# Patient Record
Sex: Female | Born: 1935 | Race: White | Hispanic: No | Smoking: Never smoker
Health system: Southern US, Community
[De-identification: ages and names within clinical notes are randomized; demographics above are authoritative.]

## PROBLEM LIST (undated history)

## (undated) DIAGNOSIS — E079 Disorder of thyroid, unspecified: Secondary | ICD-10-CM

## (undated) DIAGNOSIS — T8859XA Other complications of anesthesia, initial encounter: Secondary | ICD-10-CM

## (undated) DIAGNOSIS — M1990 Unspecified osteoarthritis, unspecified site: Secondary | ICD-10-CM

## (undated) DIAGNOSIS — K219 Gastro-esophageal reflux disease without esophagitis: Secondary | ICD-10-CM

## (undated) DIAGNOSIS — I499 Cardiac arrhythmia, unspecified: Secondary | ICD-10-CM

## (undated) DIAGNOSIS — C9110 Chronic lymphocytic leukemia of B-cell type not having achieved remission: Secondary | ICD-10-CM

## (undated) DIAGNOSIS — E785 Hyperlipidemia, unspecified: Secondary | ICD-10-CM

## (undated) DIAGNOSIS — I10 Essential (primary) hypertension: Secondary | ICD-10-CM

## (undated) DIAGNOSIS — I2510 Atherosclerotic heart disease of native coronary artery without angina pectoris: Secondary | ICD-10-CM

## (undated) DIAGNOSIS — D649 Anemia, unspecified: Secondary | ICD-10-CM

## (undated) DIAGNOSIS — E119 Type 2 diabetes mellitus without complications: Secondary | ICD-10-CM

## (undated) DIAGNOSIS — T4145XA Adverse effect of unspecified anesthetic, initial encounter: Secondary | ICD-10-CM

## (undated) DIAGNOSIS — R609 Edema, unspecified: Secondary | ICD-10-CM

## (undated) DIAGNOSIS — K5790 Diverticulosis of intestine, part unspecified, without perforation or abscess without bleeding: Secondary | ICD-10-CM

## (undated) HISTORY — DX: Atherosclerotic heart disease of native coronary artery without angina pectoris: I25.10

## (undated) HISTORY — DX: Chronic lymphocytic leukemia of B-cell type not having achieved remission: C91.10

## (undated) HISTORY — DX: Essential (primary) hypertension: I10

## (undated) HISTORY — DX: Disorder of thyroid, unspecified: E07.9

## (undated) HISTORY — DX: Hyperlipidemia, unspecified: E78.5

## (undated) HISTORY — DX: Diverticulosis of intestine, part unspecified, without perforation or abscess without bleeding: K57.90

## (undated) HISTORY — DX: Type 2 diabetes mellitus without complications: E11.9

---

## 1998-09-01 HISTORY — PX: CORONARY ARTERY BYPASS GRAFT: SHX141

## 2004-10-21 ENCOUNTER — Ambulatory Visit: Payer: Self-pay

## 2004-10-30 ENCOUNTER — Ambulatory Visit: Payer: Self-pay

## 2004-12-03 ENCOUNTER — Ambulatory Visit: Payer: Self-pay

## 2004-12-24 ENCOUNTER — Ambulatory Visit: Payer: Self-pay | Admitting: Internal Medicine

## 2004-12-30 ENCOUNTER — Ambulatory Visit: Payer: Self-pay

## 2005-01-30 ENCOUNTER — Ambulatory Visit: Payer: Self-pay

## 2005-03-01 ENCOUNTER — Ambulatory Visit: Payer: Self-pay

## 2005-11-12 ENCOUNTER — Ambulatory Visit: Payer: Self-pay | Admitting: Unknown Physician Specialty

## 2005-11-26 ENCOUNTER — Ambulatory Visit: Payer: Self-pay | Admitting: Unknown Physician Specialty

## 2006-01-20 ENCOUNTER — Ambulatory Visit: Payer: Self-pay | Admitting: Internal Medicine

## 2006-04-28 ENCOUNTER — Ambulatory Visit: Payer: Self-pay | Admitting: Unknown Physician Specialty

## 2007-04-08 ENCOUNTER — Ambulatory Visit: Payer: Self-pay | Admitting: Internal Medicine

## 2007-05-12 ENCOUNTER — Ambulatory Visit: Payer: Self-pay | Admitting: Internal Medicine

## 2007-06-02 ENCOUNTER — Ambulatory Visit: Payer: Self-pay | Admitting: Internal Medicine

## 2008-05-31 ENCOUNTER — Ambulatory Visit: Payer: Self-pay | Admitting: Internal Medicine

## 2008-06-01 ENCOUNTER — Ambulatory Visit: Payer: Self-pay | Admitting: Internal Medicine

## 2008-06-21 ENCOUNTER — Ambulatory Visit: Payer: Self-pay | Admitting: Internal Medicine

## 2008-07-02 ENCOUNTER — Ambulatory Visit: Payer: Self-pay | Admitting: Internal Medicine

## 2008-09-04 ENCOUNTER — Ambulatory Visit: Payer: Self-pay | Admitting: Internal Medicine

## 2008-10-02 ENCOUNTER — Ambulatory Visit: Payer: Self-pay | Admitting: Internal Medicine

## 2009-04-01 ENCOUNTER — Ambulatory Visit: Payer: Self-pay | Admitting: Internal Medicine

## 2009-04-18 ENCOUNTER — Ambulatory Visit: Payer: Self-pay | Admitting: Internal Medicine

## 2009-05-02 ENCOUNTER — Ambulatory Visit: Payer: Self-pay | Admitting: Internal Medicine

## 2009-07-10 ENCOUNTER — Ambulatory Visit: Payer: Self-pay | Admitting: Internal Medicine

## 2010-09-04 ENCOUNTER — Ambulatory Visit: Payer: Self-pay | Admitting: Internal Medicine

## 2011-02-17 ENCOUNTER — Encounter: Payer: Self-pay | Admitting: Otolaryngology

## 2011-03-02 ENCOUNTER — Encounter: Payer: Self-pay | Admitting: Otolaryngology

## 2011-04-02 ENCOUNTER — Encounter: Payer: Self-pay | Admitting: Otolaryngology

## 2012-01-08 ENCOUNTER — Ambulatory Visit: Payer: Self-pay | Admitting: Internal Medicine

## 2013-01-10 ENCOUNTER — Ambulatory Visit: Payer: Self-pay | Admitting: Internal Medicine

## 2014-01-25 ENCOUNTER — Ambulatory Visit: Payer: Self-pay | Admitting: Internal Medicine

## 2014-04-26 ENCOUNTER — Ambulatory Visit: Payer: Self-pay | Admitting: Internal Medicine

## 2014-05-02 ENCOUNTER — Ambulatory Visit: Payer: Self-pay | Admitting: Internal Medicine

## 2015-05-08 ENCOUNTER — Ambulatory Visit
Admission: RE | Admit: 2015-05-08 | Discharge: 2015-05-08 | Disposition: A | Discharge status: Home / Self Care | Payer: Medicare Other | Source: Ambulatory Visit | Attending: Otolaryngology | Admitting: Otolaryngology

## 2015-05-08 ENCOUNTER — Other Ambulatory Visit: Payer: Self-pay | Admitting: Otolaryngology

## 2015-05-08 DIAGNOSIS — Z951 Presence of aortocoronary bypass graft: Secondary | ICD-10-CM | POA: Diagnosis not present

## 2015-05-08 DIAGNOSIS — R05 Cough: Secondary | ICD-10-CM

## 2015-05-08 DIAGNOSIS — R059 Cough, unspecified: Secondary | ICD-10-CM

## 2016-04-09 ENCOUNTER — Other Ambulatory Visit: Payer: Self-pay | Admitting: Internal Medicine

## 2017-11-06 ENCOUNTER — Other Ambulatory Visit: Payer: Self-pay | Admitting: Otolaryngology

## 2017-11-06 ENCOUNTER — Ambulatory Visit
Admission: RE | Admit: 2017-11-06 | Discharge: 2017-11-06 | Disposition: A | Discharge status: Home / Self Care | Payer: Medicare HMO | Source: Ambulatory Visit | Attending: Otolaryngology | Admitting: Otolaryngology

## 2017-11-06 DIAGNOSIS — I700 Atherosclerosis of aorta: Secondary | ICD-10-CM | POA: Insufficient documentation

## 2017-11-06 DIAGNOSIS — R05 Cough: Secondary | ICD-10-CM | POA: Insufficient documentation

## 2017-11-06 DIAGNOSIS — R059 Cough, unspecified: Secondary | ICD-10-CM

## 2018-05-24 ENCOUNTER — Encounter: Payer: Medicare HMO | Attending: Internal Medicine | Admitting: Dietician

## 2018-05-24 ENCOUNTER — Encounter: Payer: Self-pay | Admitting: Dietician

## 2018-05-24 VITALS — BP 132/60 | Wt 156.7 lb

## 2018-05-24 DIAGNOSIS — Z9641 Presence of insulin pump (external) (internal): Secondary | ICD-10-CM | POA: Insufficient documentation

## 2018-05-24 DIAGNOSIS — Z713 Dietary counseling and surveillance: Secondary | ICD-10-CM | POA: Insufficient documentation

## 2018-05-24 DIAGNOSIS — E10649 Type 1 diabetes mellitus with hypoglycemia without coma: Secondary | ICD-10-CM | POA: Insufficient documentation

## 2018-05-24 DIAGNOSIS — E109 Type 1 diabetes mellitus without complications: Secondary | ICD-10-CM

## 2018-05-24 NOTE — Patient Instructions (Addendum)
Take meal boluses 15 min. before eating when able Change pump set and site every 2-3 days Carry glucose tablets or candy at all times Call [facility] with any questions or problems [phone]

## 2018-05-25 ENCOUNTER — Encounter: Payer: Self-pay | Admitting: Dietician

## 2018-05-25 NOTE — Progress Notes (Signed)
Called pt at 10:20am-pt reports FBG 400 this am-she removed her pump site and it was kinked-gave correction by a needle and inserted new pump site. To call me in 1-2 hours if BG not coming down

## 2018-05-27 NOTE — Progress Notes (Signed)
Diabetes Self-Management Education  Visit Type: First/Initial  Appt. Start Time: 1500 Appt. End Time: 1700  05/27/2018  [name], identified by name and date of birth, is a 84 y.o. female with a diagnosis of Diabetes: Type 1.   ASSESSMENT  Blood pressure 132/60, weight 156 lb 11.2 oz (71.1 kg). There is no height or weight on file to calculate BMI.  Diabetes Self-Management Education - 05/27/18 1120      Visit Information   Visit Type  First/Initial      Initial Visit   Diabetes Type  Type 1      Health Coping   How would you rate your overall health?  Fair      Psychosocial Assessment   Patient Belief/Attitude about Diabetes  Motivated to manage diabetes    Self-care barriers  None    Self-management support  Doctor's office;Family    Other persons present  Patient    Patient Concerns  Glycemic Control   for 670G pump training (upgrade), prevent complications, become more fit   Special Needs  None    Preferred Learning Style  Visual;Hands on    Learning Readiness  Ready    What is the last grade level you completed in school?  college      Pre-Education Assessment   Patient understands the diabetes disease and treatment process.  Demonstrates understanding / competency    Patient understands incorporating nutritional management into lifestyle.  Demonstrates understanding / competency    Patient undertands incorporating physical activity into lifestyle.  Demonstrates understanding / competency    Patient understands using medications safely.  Needs Instruction   needs training on new Medtronic 670G insulin pump(upgrade-currently wears older Medtronic  pump)   Patient understands monitoring blood glucose, interpreting and using results  Demonstrates understanding / competency    Patient understands prevention, detection, and treatment of acute complications.  Demonstrates understanding / competency    Patient understands prevention, detection, and treatment of  chronic complications.  Demonstrates understanding / competency    Patient understands how to develop strategies to address psychosocial issues.  Demonstrates understanding / competency    Patient understands how to develop strategies to promote health/change behavior.  Demonstrates understanding / competency      Complications   Last HgB A1C per patient/outside source  7 %   12-07-17   How often do you check your blood sugar?  > 4 times/day ; BG 162 prior to starting new pump   Have you had a dilated eye exam in the past 12 months?  Yes   12-2017   Have you had a dental exam in the past 12 months?  Yes   11-2017   Are you checking your feet?  Yes    How many days per week are you checking your feet?  7      Dietary Intake   Breakfast  eats breakfast at 8a (lives at [facility] and eats prepared meals there    Snack (morning)  as needed for low BG    Lunch  lunch at 12p    Snack (afternoon)  as needed for low BG    Dinner  supper at [employer] (evening)  as needed for low BG    Beverage(s)  drinks water 4-5x/day and diet  or unsweetened beverages 6-7x/day      Exercise   Exercise Type  Light (walking / raking leaves)   wts and cardio     Patient Education  Previous Diabetes Education  Yes (please comment)    Medications  Reviewed patients medication for diabetes, action, purpose, timing of dose and side effects.   trained on features and use of Medtronic 670G insulin pump using Novolog  and started on pump with settings from old pump and per MD    Acute complications  Taught treatment of hypoglycemia - the 15 rule.;Discussed and identified patients' treatment of hyperglycemia.      Outcomes   Expected Outcomes  Demonstrated interest in learning. Expect positive outcomes       Individualized Plan for Diabetes Self-Management Training:   Learning Objective:  Patient will have a greater understanding of diabetes self-management. Patient education plan is to attend  individual and/or group sessions per assessed needs and concerns.   Plan:   Patient Instructions  Take meal boluses 15 min. before eating when able Carry candy or glucose tablets at all times Change pump set and site every 2-3 days Call [facility] with any questions or problems   Expected Outcomes:  Demonstrated interest in learning. Expect positive outcomes  Education material provided: Medtronic Training booklet for 670G pump  If problems or questions, patient to contact team via:  [phone]  Future DSME appointment:

## 2018-05-31 ENCOUNTER — Encounter: Payer: Self-pay | Admitting: Dietician

## 2018-05-31 NOTE — Progress Notes (Signed)
Called pt on 05-29-18 and pt reports no problems with pump and BG's have been much better 90's-100's; advised pt again to call me if any problems or questions arise

## 2018-05-31 NOTE — Progress Notes (Signed)
Called pt on 05-26-18-pt reports she changed her reservoir and  insertion site without problem but BG's have been elevated mostly 300's and she has been taking injection of Novolog when pump bolus does not bring down BG and it comes down to 200's but then goes back up-also reports having small-moderate urine ketones. Had pt go into pump and check basal rates which reveals zero basal rates!  Pt reports she has been pushing buttons on her pump and apparently erased the basal rates.  Assisted pt in programming basal rates into pump again and checked ICR, ISF, BG targets and active insulin time which were all correct.  Advised pt to call me if she continues to have elevated BG's.

## 2018-06-07 ENCOUNTER — Encounter: Payer: Self-pay | Admitting: *Deleted

## 2018-06-08 ENCOUNTER — Encounter
Admission: RE | Disposition: A | Discharge status: Home / Self Care | Payer: Self-pay | Source: Ambulatory Visit | Attending: Ophthalmology

## 2018-06-08 ENCOUNTER — Ambulatory Visit: Payer: Medicare HMO | Admitting: Anesthesiology

## 2018-06-08 ENCOUNTER — Other Ambulatory Visit: Payer: Self-pay

## 2018-06-08 ENCOUNTER — Ambulatory Visit
Admission: RE | Admit: 2018-06-08 | Discharge: 2018-06-08 | Disposition: A | Discharge status: Home / Self Care | Payer: Medicare HMO | Source: Ambulatory Visit | Attending: Ophthalmology | Admitting: Ophthalmology

## 2018-06-08 DIAGNOSIS — K219 Gastro-esophageal reflux disease without esophagitis: Secondary | ICD-10-CM | POA: Insufficient documentation

## 2018-06-08 DIAGNOSIS — Z79899 Other long term (current) drug therapy: Secondary | ICD-10-CM | POA: Diagnosis not present

## 2018-06-08 DIAGNOSIS — C9110 Chronic lymphocytic leukemia of B-cell type not having achieved remission: Secondary | ICD-10-CM | POA: Diagnosis not present

## 2018-06-08 DIAGNOSIS — H2512 Age-related nuclear cataract, left eye: Secondary | ICD-10-CM | POA: Diagnosis present

## 2018-06-08 DIAGNOSIS — Z794 Long term (current) use of insulin: Secondary | ICD-10-CM | POA: Insufficient documentation

## 2018-06-08 DIAGNOSIS — I2510 Atherosclerotic heart disease of native coronary artery without angina pectoris: Secondary | ICD-10-CM | POA: Insufficient documentation

## 2018-06-08 DIAGNOSIS — I10 Essential (primary) hypertension: Secondary | ICD-10-CM | POA: Insufficient documentation

## 2018-06-08 DIAGNOSIS — E785 Hyperlipidemia, unspecified: Secondary | ICD-10-CM | POA: Insufficient documentation

## 2018-06-08 DIAGNOSIS — E1136 Type 2 diabetes mellitus with diabetic cataract: Secondary | ICD-10-CM | POA: Insufficient documentation

## 2018-06-08 DIAGNOSIS — Z951 Presence of aortocoronary bypass graft: Secondary | ICD-10-CM | POA: Insufficient documentation

## 2018-06-08 DIAGNOSIS — Z7982 Long term (current) use of aspirin: Secondary | ICD-10-CM | POA: Diagnosis not present

## 2018-06-08 HISTORY — DX: Edema, unspecified: R60.9

## 2018-06-08 HISTORY — DX: Gastro-esophageal reflux disease without esophagitis: K21.9

## 2018-06-08 HISTORY — DX: Cardiac arrhythmia, unspecified: I49.9

## 2018-06-08 HISTORY — PX: CATARACT EXTRACTION W/PHACO: SHX586

## 2018-06-08 LAB — GLUCOSE, CAPILLARY: GLUCOSE-CAPILLARY: 255 mg/dL — AB (ref 70–99)

## 2018-06-08 SURGERY — PHACOEMULSIFICATION, CATARACT, WITH IOL INSERTION
Anesthesia: Monitor Anesthesia Care | Site: Eye | Laterality: Left

## 2018-06-08 MED ORDER — EPINEPHRINE PF 1 MG/ML IJ SOLN
INTRAMUSCULAR | Status: AC
  Filled 2018-06-08: qty 1

## 2018-06-08 MED ORDER — NA CHONDROIT SULF-NA HYALURON 40-17 MG/ML IO SOLN
INTRAOCULAR | Status: DC | PRN
  Administered 2018-06-08: 1 mL via INTRAOCULAR

## 2018-06-08 MED ORDER — MOXIFLOXACIN HCL 0.5 % OP SOLN: 1.0000 [drp] | OPHTHALMIC | Status: DC | PRN

## 2018-06-08 MED ORDER — MOXIFLOXACIN HCL 0.5 % OP SOLN
OPHTHALMIC | Status: AC
  Filled 2018-06-08: qty 3

## 2018-06-08 MED ORDER — POVIDONE-IODINE 5 % OP SOLN
OPHTHALMIC | Status: DC | PRN
  Administered 2018-06-08: 1 via OPHTHALMIC

## 2018-06-08 MED ORDER — POVIDONE-IODINE 5 % OP SOLN
OPHTHALMIC | Status: AC
  Filled 2018-06-08: qty 30

## 2018-06-08 MED ORDER — NA CHONDROIT SULF-NA HYALURON 40-17 MG/ML IO SOLN
INTRAOCULAR | Status: AC
  Filled 2018-06-08: qty 1

## 2018-06-08 MED ORDER — TETRACAINE HCL 0.5 % OP SOLN
OPHTHALMIC | Status: AC
  Administered 2018-06-08: 1 [drp] via OPHTHALMIC
  Filled 2018-06-08: qty 4

## 2018-06-08 MED ORDER — MIDAZOLAM HCL 2 MG/2ML IJ SOLN
INTRAMUSCULAR | Status: AC
  Filled 2018-06-08: qty 2

## 2018-06-08 MED ORDER — MOXIFLOXACIN HCL 0.5 % OP SOLN
OPHTHALMIC | Status: DC | PRN
  Administered 2018-06-08: .2 mL via OPHTHALMIC

## 2018-06-08 MED ORDER — LIDOCAINE HCL (PF) 4 % IJ SOLN
INTRAMUSCULAR | Status: AC
  Filled 2018-06-08: qty 5

## 2018-06-08 MED ORDER — CARBACHOL 0.01 % IO SOLN
INTRAOCULAR | Status: DC | PRN
  Administered 2018-06-08: .5 mL via INTRAOCULAR

## 2018-06-08 MED ORDER — EPINEPHRINE PF 1 MG/ML IJ SOLN
INTRAOCULAR | Status: DC | PRN
  Administered 2018-06-08: 1 mL via OPHTHALMIC

## 2018-06-08 MED ORDER — ARMC OPHTHALMIC DILATING DROPS
OPHTHALMIC | Status: AC
  Administered 2018-06-08: 1 via OPHTHALMIC
  Filled 2018-06-08: qty 0.5

## 2018-06-08 MED ORDER — MIDAZOLAM HCL 2 MG/2ML IJ SOLN
INTRAMUSCULAR | Status: DC | PRN
  Administered 2018-06-08: 0.5 mg via INTRAVENOUS
  Administered 2018-06-08: .5 mg via INTRAVENOUS

## 2018-06-08 MED ORDER — ARMC OPHTHALMIC DILATING DROPS
1.0000 "application " | OPHTHALMIC | Status: AC
  Administered 2018-06-08 (×3): 1 via OPHTHALMIC

## 2018-06-08 MED ORDER — TETRACAINE HCL 0.5 % OP SOLN
1.0000 [drp] | OPHTHALMIC | Status: DC | PRN
  Administered 2018-06-08: 1 [drp] via OPHTHALMIC

## 2018-06-08 MED ORDER — LIDOCAINE HCL (PF) 4 % IJ SOLN
INTRAOCULAR | Status: DC | PRN
  Administered 2018-06-08: 2 mL via OPHTHALMIC

## 2018-06-08 MED ORDER — SODIUM CHLORIDE 0.9 % IV SOLN
INTRAVENOUS | Status: DC
  Administered 2018-06-08: 09:00:00 via INTRAVENOUS

## 2018-06-08 SURGICAL SUPPLY — 16 items
GLOVE BIO SURGEON STRL SZ8 (GLOVE) ×3 IMPLANT
GLOVE BIOGEL M 6.5 STRL (GLOVE) ×3 IMPLANT
GLOVE SURG LX 8.0 MICRO (GLOVE) ×2
GLOVE SURG LX STRL 8.0 MICRO (GLOVE) ×1 IMPLANT
GOWN STRL REUS W/ TWL LRG LVL3 (GOWN DISPOSABLE) ×2 IMPLANT
GOWN STRL REUS W/TWL LRG LVL3 (GOWN DISPOSABLE) ×4
LABEL CATARACT MEDS ST (LABEL) ×3 IMPLANT
LENS IOL TECNIS ITEC 23.5 (Intraocular Lens) ×2 IMPLANT
PACK CATARACT (MISCELLANEOUS) ×3 IMPLANT
PACK CATARACT BRASINGTON LX (MISCELLANEOUS) ×3 IMPLANT
PACK EYE AFTER SURG (MISCELLANEOUS) ×3 IMPLANT
SOL BSS BAG (MISCELLANEOUS) ×3
SOLUTION BSS BAG (MISCELLANEOUS) ×1 IMPLANT
SYR 5ML LL (SYRINGE) ×3 IMPLANT
WATER STERILE IRR 250ML POUR (IV SOLUTION) ×3 IMPLANT
WIPE NON LINTING 3.25X3.25 (MISCELLANEOUS) ×3 IMPLANT

## 2018-06-08 NOTE — Transfer of Care (Signed)
Immediate Anesthesia Transfer of Care Note  Patient: Cassandra Gentry  Procedure(s) Performed: CATARACT EXTRACTION PHACO AND INTRAOCULAR LENS PLACEMENT (IOC) (Left Eye)  Patient Location: PACU and Short Stay  Anesthesia Type:MAC  Level of Consciousness: awake  Airway & Oxygen Therapy: Patient Spontanous Breathing  Post-op Assessment: Report given to RN  Post vital signs: stable  Last Vitals:  Vitals Value Taken Time  BP    Temp    Pulse    Resp    SpO2      Last Pain:  Vitals:   06/08/18 0848  TempSrc: Temporal         Complications: No apparent anesthesia complications

## 2018-06-08 NOTE — Anesthesia Postprocedure Evaluation (Signed)
Anesthesia Post Note  Patient: Cassandra Gentry  Procedure(s) Performed: CATARACT EXTRACTION PHACO AND INTRAOCULAR LENS PLACEMENT ([place]) (Left Eye)  Patient location during evaluation: Short Stay Anesthesia Type: MAC Level of consciousness: awake and awake and alert Pain management: pain level controlled Vital Signs Assessment: post-procedure vital signs reviewed and stable Respiratory status: spontaneous breathing and respiratory function stable Cardiovascular status: stable Postop Assessment: no apparent nausea or vomiting Anesthetic complications: no     Last Vitals:  Vitals:   06/08/18 0848  BP: (!) 181/72  Pulse: 61  Resp: 16  Temp: (!) 36.2 C  SpO2: 100%    Last Pain:  Vitals:   06/08/18 0848  TempSrc: Temporal                 Norbert Malkin E Angelus Hoopes

## 2018-06-08 NOTE — H&P (Signed)
All labs reviewed. Abnormal studies sent to patients PCP when indicated.  Previous H&P reviewed, patient examined, there are NO CHANGES.  Cassandra Kirsten Porfilio10/8/20199:56 AM

## 2018-06-08 NOTE — Op Note (Signed)
PREOPERATIVE DIAGNOSIS:  Nuclear sclerotic cataract of the left eye.   POSTOPERATIVE DIAGNOSIS:  Nuclear sclerotic cataract of the left eye.   OPERATIVE PROCEDURE: Procedure(s): CATARACT EXTRACTION PHACO AND INTRAOCULAR LENS PLACEMENT (I[facility])   SURGEON:  Birder Robson, MD.   ANESTHESIA:  Anesthesiologist: Emmie Niemann, MD CRNA: Geraldine Contras, CRNA  1.      Managed anesthesia care. 2.     0.52ml of Shugarcaine was instilled following the paracentesis   COMPLICATIONS:  None.   TECHNIQUE:   Stop and chop   DESCRIPTION OF PROCEDURE:  The patient was examined and consented in the preoperative holding area where the aforementioned topical anesthesia was applied to the left eye and then brought back to the Operating Room where the left eye was prepped and draped in the usual sterile ophthalmic fashion and a lid speculum was placed. A paracentesis was created with the side port blade and the anterior chamber was filled with viscoelastic. A near clear corneal incision was performed with the steel keratome. A continuous curvilinear capsulorrhexis was performed with a cystotome followed by the capsulorrhexis forceps. Hydrodissection and hydrodelineation were carried out with BSS on a blunt cannula. The lens was removed in a stop and chop  technique and the remaining cortical material was removed with the irrigation-aspiration handpiece. The capsular bag was inflated with viscoelastic and the Technis ZCB00 lens was placed in the capsular bag without complication. The remaining viscoelastic was removed from the eye with the irrigation-aspiration handpiece. The wounds were hydrated. The anterior chamber was flushed with Miostat and the eye was inflated to physiologic pressure. 0.86ml Vigamox was placed in the anterior chamber. The wounds were found to be water tight. The eye was dressed with Vigamox. The patient was given protective glasses to wear throughout the day and a shield with which to sleep  tonight. The patient was also given drops with which to begin a drop regimen today and will follow-up with me in one day. Implant Name Type Inv. Item Serial No. Manufacturer Lot No. LRB No. Used  LENS IOL DIOP 23.5 - [id number] Intraocular Lens LENS IOL DIOP 23.5 [id number]  Left 1    Procedure(s) with comments: CATARACT EXTRACTION PHACO AND INTRAOCULAR LENS PLACEMENT (IOC) (Left) - [place] 02:20 CDE 23.41 Fluid pack lot # [id number]  Electronically signed: Birder Robson 06/08/2018 10:25 AM

## 2018-06-08 NOTE — Anesthesia Post-op Follow-up Note (Signed)
Anesthesia QCDR form completed.        

## 2018-06-08 NOTE — Anesthesia Preprocedure Evaluation (Signed)
Anesthesia Evaluation  Patient identified by MRN, date of birth, ID band Patient awake    Reviewed: Allergy & Precautions, NPO status , Patient's Chart, lab work & pertinent test results  History of Anesthesia Complications Negative for: history of anesthetic complications  Airway Mallampati: III  TM Distance: >3 FB Neck ROM: Full    Dental  (+) Poor Dentition   Pulmonary neg pulmonary ROS, neg sleep apnea, neg COPD,    breath sounds clear to auscultation- rhonchi (-) wheezing      Cardiovascular hypertension, + CAD and + CABG (2000)  (-) Cardiac Stents  Rhythm:Regular Rate:Normal - Systolic murmurs and - Diastolic murmurs    Neuro/Psych negative neurological ROS  negative psych ROS   GI/Hepatic Neg liver ROS, GERD  ,  Endo/Other  diabetes, Insulin Dependent  Renal/GU negative Renal ROS     Musculoskeletal negative musculoskeletal ROS (+)   Abdominal (+) - obese,   Peds  Hematology negative hematology ROS (+)   Anesthesia Other Findings Past Medical History: No date: Chronic lymphocytic leukemia (HCC) No date: Coronary artery disease No date: Diabetes mellitus without complication (HCC) No date: Diverticulosis No date: Dysrhythmia No date: Edema     Comment:  LEFT LEG No date: GERD (gastroesophageal reflux disease) No date: Hyperlipidemia No date: Hypertension No date: Thyroid disease   Reproductive/Obstetrics                             Anesthesia Physical Anesthesia Plan  ASA: III  Anesthesia Plan: MAC   Post-op Pain Management:    Induction: Intravenous  PONV Risk Score and Plan: 2 and Midazolam  Airway Management Planned: Natural Airway  Additional Equipment:   Intra-op Plan:   Post-operative Plan:   Informed Consent: I have reviewed the patients History and Physical, chart, labs and discussed the procedure including the risks, benefits and alternatives for the  proposed anesthesia with the patient or authorized representative who has indicated his/her understanding and acceptance.     Plan Discussed with: CRNA and Anesthesiologist  Anesthesia Plan Comments:         Anesthesia Quick Evaluation

## 2018-06-08 NOTE — Discharge Instructions (Signed)
Eye Surgery Discharge Instructions    Expect mild scratchy sensation or mild soreness. DO NOT RUB YOUR EYE!  The day of surgery:  Minimal physical activity, but bed rest is not required  No reading, computer work, or close hand work  No bending, lifting, or straining.  May watch TV  For 24 hours:  No driving, legal decisions, or alcoholic beverages  Safety precautions  Eat anything you prefer: It is better to start with liquids, then soup then solid foods.  _____ Eye patch should be worn until postoperative exam tomorrow.  ____ Solar shield eyeglasses should be worn for comfort in the sunlight/patch while sleeping  Resume all regular medications including aspirin or Coumadin if these were discontinued prior to surgery. You may shower, bathe, shave, or wash your hair. Tylenol may be taken for mild discomfort.  Call your doctor if you experience significant pain, nausea, or vomiting, fever > 101 or other signs of infection. [phone] or [phone] Specific instructions:  Follow-up Information    Birder Robson, MD Follow up.   Specialty:  Ophthalmology Why:  06/09/18 at 9:40 Contact information: [place] [address] [phone]

## 2018-06-09 ENCOUNTER — Encounter: Payer: Self-pay | Admitting: Ophthalmology

## 2018-07-12 ENCOUNTER — Encounter: Payer: Self-pay | Admitting: *Deleted

## 2018-07-13 ENCOUNTER — Ambulatory Visit: Payer: Medicare HMO | Admitting: Anesthesiology

## 2018-07-13 ENCOUNTER — Other Ambulatory Visit: Payer: Self-pay

## 2018-07-13 ENCOUNTER — Encounter: Payer: Self-pay | Admitting: *Deleted

## 2018-07-13 ENCOUNTER — Encounter
Admission: RE | Disposition: A | Discharge status: Home / Self Care | Payer: Self-pay | Source: Ambulatory Visit | Attending: Ophthalmology

## 2018-07-13 ENCOUNTER — Ambulatory Visit
Admission: RE | Admit: 2018-07-13 | Discharge: 2018-07-13 | Disposition: A | Discharge status: Home / Self Care | Payer: Medicare HMO | Source: Ambulatory Visit | Attending: Ophthalmology | Admitting: Ophthalmology

## 2018-07-13 DIAGNOSIS — K219 Gastro-esophageal reflux disease without esophagitis: Secondary | ICD-10-CM | POA: Insufficient documentation

## 2018-07-13 DIAGNOSIS — E1136 Type 2 diabetes mellitus with diabetic cataract: Secondary | ICD-10-CM | POA: Insufficient documentation

## 2018-07-13 DIAGNOSIS — E7800 Pure hypercholesterolemia, unspecified: Secondary | ICD-10-CM | POA: Insufficient documentation

## 2018-07-13 DIAGNOSIS — Z85828 Personal history of other malignant neoplasm of skin: Secondary | ICD-10-CM | POA: Diagnosis not present

## 2018-07-13 DIAGNOSIS — H2511 Age-related nuclear cataract, right eye: Secondary | ICD-10-CM | POA: Diagnosis present

## 2018-07-13 DIAGNOSIS — Z881 Allergy status to other antibiotic agents status: Secondary | ICD-10-CM | POA: Insufficient documentation

## 2018-07-13 DIAGNOSIS — I2510 Atherosclerotic heart disease of native coronary artery without angina pectoris: Secondary | ICD-10-CM | POA: Insufficient documentation

## 2018-07-13 DIAGNOSIS — I499 Cardiac arrhythmia, unspecified: Secondary | ICD-10-CM | POA: Diagnosis not present

## 2018-07-13 DIAGNOSIS — M25472 Effusion, left ankle: Secondary | ICD-10-CM | POA: Diagnosis not present

## 2018-07-13 DIAGNOSIS — I491 Atrial premature depolarization: Secondary | ICD-10-CM | POA: Diagnosis not present

## 2018-07-13 DIAGNOSIS — Z9842 Cataract extraction status, left eye: Secondary | ICD-10-CM | POA: Insufficient documentation

## 2018-07-13 DIAGNOSIS — M810 Age-related osteoporosis without current pathological fracture: Secondary | ICD-10-CM | POA: Insufficient documentation

## 2018-07-13 DIAGNOSIS — Z951 Presence of aortocoronary bypass graft: Secondary | ICD-10-CM | POA: Diagnosis not present

## 2018-07-13 DIAGNOSIS — I10 Essential (primary) hypertension: Secondary | ICD-10-CM | POA: Insufficient documentation

## 2018-07-13 DIAGNOSIS — Z91013 Allergy to seafood: Secondary | ICD-10-CM | POA: Insufficient documentation

## 2018-07-13 HISTORY — DX: Adverse effect of unspecified anesthetic, initial encounter: T41.45XA

## 2018-07-13 HISTORY — PX: CATARACT EXTRACTION W/PHACO: SHX586

## 2018-07-13 HISTORY — DX: Other complications of anesthesia, initial encounter: T88.59XA

## 2018-07-13 LAB — GLUCOSE, CAPILLARY: Glucose-Capillary: 240 mg/dL — ABNORMAL HIGH (ref 70–99)

## 2018-07-13 SURGERY — PHACOEMULSIFICATION, CATARACT, WITH IOL INSERTION
Anesthesia: Monitor Anesthesia Care | Site: Eye | Laterality: Right

## 2018-07-13 MED ORDER — LIDOCAINE HCL (PF) 4 % IJ SOLN
INTRAMUSCULAR | Status: AC
  Filled 2018-07-13: qty 5

## 2018-07-13 MED ORDER — NA CHONDROIT SULF-NA HYALURON 40-17 MG/ML IO SOLN
INTRAOCULAR | Status: AC
  Filled 2018-07-13: qty 1

## 2018-07-13 MED ORDER — LIDOCAINE HCL (PF) 4 % IJ SOLN
INTRAOCULAR | Status: DC | PRN
  Administered 2018-07-13: 4 mL via OPHTHALMIC

## 2018-07-13 MED ORDER — POVIDONE-IODINE 5 % OP SOLN
OPHTHALMIC | Status: DC | PRN
  Administered 2018-07-13: 1 via OPHTHALMIC

## 2018-07-13 MED ORDER — ARMC OPHTHALMIC DILATING DROPS
1.0000 "application " | OPHTHALMIC | Status: AC
  Administered 2018-07-13 (×2): 1 via OPHTHALMIC

## 2018-07-13 MED ORDER — ARMC OPHTHALMIC DILATING DROPS
OPHTHALMIC | Status: AC
  Administered 2018-07-13: 1 via OPHTHALMIC
  Filled 2018-07-13: qty 0.5

## 2018-07-13 MED ORDER — FENTANYL CITRATE (PF) 100 MCG/2ML IJ SOLN
INTRAMUSCULAR | Status: DC | PRN
  Administered 2018-07-13 (×2): 25 ug via INTRAVENOUS

## 2018-07-13 MED ORDER — POVIDONE-IODINE 5 % OP SOLN
OPHTHALMIC | Status: AC
  Filled 2018-07-13: qty 30

## 2018-07-13 MED ORDER — TETRACAINE HCL 0.5 % OP SOLN
OPHTHALMIC | Status: AC
  Administered 2018-07-13: 1 [drp] via OPHTHALMIC
  Filled 2018-07-13: qty 4

## 2018-07-13 MED ORDER — SODIUM CHLORIDE 0.9 % IV SOLN
INTRAVENOUS | Status: DC
  Administered 2018-07-13: 07:00:00 via INTRAVENOUS

## 2018-07-13 MED ORDER — FENTANYL CITRATE (PF) 100 MCG/2ML IJ SOLN
INTRAMUSCULAR | Status: AC
  Filled 2018-07-13: qty 2

## 2018-07-13 MED ORDER — NA CHONDROIT SULF-NA HYALURON 40-17 MG/ML IO SOLN
INTRAOCULAR | Status: DC | PRN
  Administered 2018-07-13: 1 mL via INTRAOCULAR

## 2018-07-13 MED ORDER — CARBACHOL 0.01 % IO SOLN
INTRAOCULAR | Status: DC | PRN
  Administered 2018-07-13: 0.5 mL via INTRAOCULAR

## 2018-07-13 MED ORDER — ONDANSETRON HCL 4 MG/2ML IJ SOLN
INTRAMUSCULAR | Status: DC | PRN
  Administered 2018-07-13: 4 mg via INTRAVENOUS

## 2018-07-13 MED ORDER — MIDAZOLAM HCL 2 MG/2ML IJ SOLN
INTRAMUSCULAR | Status: AC
  Filled 2018-07-13: qty 2

## 2018-07-13 MED ORDER — ONDANSETRON HCL 4 MG/2ML IJ SOLN
INTRAMUSCULAR | Status: AC
  Filled 2018-07-13: qty 2

## 2018-07-13 MED ORDER — TETRACAINE HCL 0.5 % OP SOLN
1.0000 [drp] | OPHTHALMIC | Status: DC | PRN
  Administered 2018-07-13 (×2): 1 [drp] via OPHTHALMIC

## 2018-07-13 MED ORDER — EPINEPHRINE PF 1 MG/ML IJ SOLN
INTRAMUSCULAR | Status: AC
  Filled 2018-07-13: qty 1

## 2018-07-13 MED ORDER — EPINEPHRINE PF 1 MG/ML IJ SOLN
INTRAOCULAR | Status: DC | PRN
  Administered 2018-07-13: 08:00:00 via OPHTHALMIC

## 2018-07-13 MED ORDER — MOXIFLOXACIN HCL 0.5 % OP SOLN
OPHTHALMIC | Status: AC
  Filled 2018-07-13: qty 3

## 2018-07-13 MED ORDER — MOXIFLOXACIN HCL 0.5 % OP SOLN
OPHTHALMIC | Status: DC | PRN
  Administered 2018-07-13: 0.2 mL via OPHTHALMIC

## 2018-07-13 MED ORDER — MOXIFLOXACIN HCL 0.5 % OP SOLN: 1.0000 [drp] | OPHTHALMIC | Status: DC | PRN

## 2018-07-13 SURGICAL SUPPLY — 16 items
GLOVE BIO SURGEON STRL SZ8 (GLOVE) ×3 IMPLANT
GLOVE BIOGEL M 6.5 STRL (GLOVE) ×3 IMPLANT
GLOVE SURG LX 8.0 MICRO (GLOVE) ×2
GLOVE SURG LX STRL 8.0 MICRO (GLOVE) ×1 IMPLANT
GOWN STRL REUS W/ TWL LRG LVL3 (GOWN DISPOSABLE) ×2 IMPLANT
GOWN STRL REUS W/TWL LRG LVL3 (GOWN DISPOSABLE) ×4
LABEL CATARACT MEDS ST (LABEL) ×3 IMPLANT
LENS IOL TECNIS ITEC 23.0 (Intraocular Lens) ×3 IMPLANT
PACK CATARACT (MISCELLANEOUS) ×3 IMPLANT
PACK CATARACT BRASINGTON LX (MISCELLANEOUS) ×3 IMPLANT
PACK EYE AFTER SURG (MISCELLANEOUS) ×3 IMPLANT
SOL BSS BAG (MISCELLANEOUS) ×3
SOLUTION BSS BAG (MISCELLANEOUS) ×1 IMPLANT
SYR 5ML LL (SYRINGE) ×3 IMPLANT
WATER STERILE IRR 250ML POUR (IV SOLUTION) ×3 IMPLANT
WIPE NON LINTING 3.25X3.25 (MISCELLANEOUS) ×3 IMPLANT

## 2018-07-13 NOTE — Anesthesia Post-op Follow-up Note (Signed)
Anesthesia QCDR form completed.        

## 2018-07-13 NOTE — H&P (Signed)
All labs reviewed. Abnormal studies sent to patients PCP when indicated.  Previous H&P reviewed, patient examined, there are NO CHANGES.  Gwyndolyn Saxon Porfilio11/12/20198:02 AM

## 2018-07-13 NOTE — Discharge Instructions (Signed)
Eye Surgery Discharge Instructions    Expect mild scratchy sensation or mild soreness. DO NOT RUB YOUR EYE!  The day of surgery:  Minimal physical activity, but bed rest is not required  No reading, computer work, or close hand work  No bending, lifting, or straining.  May watch TV  For 24 hours:  No driving, legal decisions, or alcoholic beverages  Safety precautions  Eat anything you prefer: It is better to start with liquids, then soup then solid foods.  _____ Eye patch should be worn until postoperative exam tomorrow.  ____ Solar shield eyeglasses should be worn for comfort in the sunlight/patch while sleeping  Resume all regular medications including aspirin or Coumadin if these were discontinued prior to surgery. You may shower, bathe, shave, or wash your hair. Tylenol may be taken for mild discomfort.  Call your doctor if you experience significant pain, nausea, or vomiting, fever > 101 or other signs of infection. [phone] or [phone] Specific instructions:  Follow-up Information    Birder Robson, MD Follow up on 07/14/2018.   Specialty:  Ophthalmology Why:  appointment time @ 9:10 AM Contact information: [place] [address] [phone]

## 2018-07-13 NOTE — Transfer of Care (Signed)
Immediate Anesthesia Transfer of Care Note  Patient: Lanelle Bal  Procedure(s) Performed: Procedure(s) with comments: CATARACT EXTRACTION PHACO AND INTRAOCULAR LENS PLACEMENT (I[facility]) (Right) - [place] 01:18.2 CDE 15.42 Fluid Pack Lot # [id number]  Patient Location: PHASE II  Anesthesia Type:MAC  Level of Consciousness: Awake, Alert, Oriented  Airway & Oxygen Therapy: Patient Spontanous Breathing and Patient on room air   Post-op Assessment: Report given to RN and Post -op Vital signs reviewed and stable  Post vital signs: Reviewed and stable  Last Vitals:  Vitals:   07/13/18 0640 07/13/18 0832  BP: (!) 152/49 (!) 168/68  Pulse: (!) 42 62  Resp: 20 16  Temp: (!) 36.3 C (!) 36.2 C  SpO2: 29% 79%    Complications: No apparent anesthesia complications

## 2018-07-13 NOTE — Anesthesia Procedure Notes (Signed)
Procedure Name: MAC Date/Time: 07/13/2018 8:15 AM Performed by: Johnna Acosta, CRNA Pre-anesthesia Checklist: Patient identified, Emergency Drugs available, Suction available, Patient being monitored and Timeout performed Patient Re-evaluated:Patient Re-evaluated prior to induction Oxygen Delivery Method: Nasal cannula

## 2018-07-13 NOTE — Anesthesia Preprocedure Evaluation (Signed)
Anesthesia Evaluation  Patient identified by MRN, date of birth, ID band Patient awake    Reviewed: Allergy & Precautions, NPO status , Patient's Chart, lab work & pertinent test results, reviewed documented beta blocker date and time   History of Anesthesia Complications (+) history of anesthetic complications  Airway Mallampati: II  TM Distance: >3 FB     Dental  (+) Chipped   Pulmonary           Cardiovascular hypertension, Pt. on medications and Pt. on home beta blockers + CAD and + CABG  + dysrhythmias      Neuro/Psych    GI/Hepatic GERD  Controlled,  Endo/Other  diabetes, Type 2  Renal/GU      Musculoskeletal   Abdominal   Peds  Hematology   Anesthesia Other Findings   Reproductive/Obstetrics                             Anesthesia Physical Anesthesia Plan  ASA: III  Anesthesia Plan: MAC   Post-op Pain Management:    Induction:   PONV Risk Score and Plan:   Airway Management Planned:   Additional Equipment:   Intra-op Plan:   Post-operative Plan:   Informed Consent: I have reviewed the patients History and Physical, chart, labs and discussed the procedure including the risks, benefits and alternatives for the proposed anesthesia with the patient or authorized representative who has indicated his/her understanding and acceptance.     Plan Discussed with: CRNA  Anesthesia Plan Comments:         Anesthesia Quick Evaluation

## 2018-07-13 NOTE — Anesthesia Postprocedure Evaluation (Signed)
Anesthesia Post Note  Patient: Cassandra Gentry  Procedure(s) Performed: CATARACT EXTRACTION PHACO AND INTRAOCULAR LENS PLACEMENT ([place]) (Right Eye)  Patient location during evaluation: PACU Anesthesia Type: MAC Level of consciousness: awake and alert Pain management: pain level controlled Vital Signs Assessment: post-procedure vital signs reviewed and stable Respiratory status: spontaneous breathing, nonlabored ventilation, respiratory function stable and patient connected to nasal cannula oxygen Cardiovascular status: stable and blood pressure returned to baseline Postop Assessment: no apparent nausea or vomiting Anesthetic complications: no     Last Vitals:  Vitals:   07/13/18 0640 07/13/18 0832  BP: (!) 152/49 (!) 168/68  Pulse: (!) 42 62  Resp: 20 16  Temp: (!) 36.3 C (!) 36.2 C  SpO2: 98% 97%    Last Pain:  Vitals:   07/13/18 0832  TempSrc: Temporal  PainSc: 0-No pain                 Alison Stalling

## 2018-07-13 NOTE — Anesthesia Postprocedure Evaluation (Signed)
Anesthesia Post Note  Patient: Cassandra Gentry  Procedure(s) Performed: CATARACT EXTRACTION PHACO AND INTRAOCULAR LENS PLACEMENT ([facility]) (Right Eye)  Patient location during evaluation: PACU Anesthesia Type: MAC Level of consciousness: awake and alert Pain management: pain level controlled Vital Signs Assessment: post-procedure vital signs reviewed and stable Respiratory status: spontaneous breathing, nonlabored ventilation, respiratory function stable and patient connected to nasal cannula oxygen Cardiovascular status: stable and blood pressure returned to baseline Postop Assessment: no apparent nausea or vomiting Anesthetic complications: no     Last Vitals:  Vitals:   07/13/18 0640 07/13/18 0832  BP: (!) 152/49 (!) 168/68  Pulse: (!) 42 62  Resp: 20 16  Temp: (!) 36.3 C (!) 36.2 C  SpO2: 98% 97%    Last Pain:  Vitals:   07/13/18 0832  TempSrc: Temporal  PainSc: 0-No pain                 Mikias Lanz S

## 2018-07-13 NOTE — Op Note (Signed)
PREOPERATIVE DIAGNOSIS:  Nuclear sclerotic cataract of the right eye.   POSTOPERATIVE DIAGNOSIS:  NUCLEAR SCLEROTIC CATARACT RIGHT EYE   OPERATIVE PROCEDURE: Procedure(s): CATARACT EXTRACTION PHACO AND INTRAOCULAR LENS PLACEMENT ([facility])   SURGEON:  Birder Robson, MD.   ANESTHESIA:  Anesthesiologist: Gunnar Bulla, MD CRNA: Doreen Salvage, CRNA; Johnna Acosta, CRNA  1.      Managed anesthesia care. 2.      0.26ml of Shugarcaine was instilled in the eye following the paracentesis.   COMPLICATIONS:  None.   TECHNIQUE:   Stop and chop   DESCRIPTION OF PROCEDURE:  The patient was examined and consented in the preoperative holding area where the aforementioned topical anesthesia was applied to the right eye and then brought back to the Operating Room where the right eye was prepped and draped in the usual sterile ophthalmic fashion and a lid speculum was placed. A paracentesis was created with the side port blade and the anterior chamber was filled with viscoelastic. A near clear corneal incision was performed with the steel keratome. A continuous curvilinear capsulorrhexis was performed with a cystotome followed by the capsulorrhexis forceps. Hydrodissection and hydrodelineation were carried out with BSS on a blunt cannula. The lens was removed in a stop and chop  technique and the remaining cortical material was removed with the irrigation-aspiration handpiece. The capsular bag was inflated with viscoelastic and the Technis ZCB00  lens was placed in the capsular bag without complication. The remaining viscoelastic was removed from the eye with the irrigation-aspiration handpiece. The wounds were hydrated. The anterior chamber was flushed with Miostat and the eye was inflated to physiologic pressure. 0.28ml of Vigamox was placed in the anterior chamber. The wounds were found to be water tight. The eye was dressed with Vigamox. The patient was given protective glasses to wear throughout the day  and a shield with which to sleep tonight. The patient was also given drops with which to begin a drop regimen today and will follow-up with me in one day. Implant Name Type Inv. Item Serial No. Manufacturer Lot No. LRB No. Used  LENS IOL DIOP 23.0 - [id number] Intraocular Lens LENS IOL DIOP 23.0 [id number]  Right 1   Procedure(s) with comments: CATARACT EXTRACTION PHACO AND INTRAOCULAR LENS PLACEMENT ([facility]) (Right) - [place] 01:18.2 CDE 15.42 Fluid Pack Lot # [id number]  Electronically signed: Birder Robson 07/13/2018 8:33 AM

## 2018-07-14 ENCOUNTER — Encounter: Payer: Self-pay | Admitting: Ophthalmology

## 2019-09-04 ENCOUNTER — Emergency Department: Payer: Medicare HMO

## 2019-09-04 ENCOUNTER — Other Ambulatory Visit: Payer: Self-pay

## 2019-09-04 ENCOUNTER — Emergency Department
Admission: EM | Admit: 2019-09-04 | Discharge: 2019-09-04 | Disposition: A | Discharge status: Home / Self Care | Payer: Medicare HMO | Attending: Emergency Medicine | Admitting: Emergency Medicine

## 2019-09-04 DIAGNOSIS — R1110 Vomiting, unspecified: Secondary | ICD-10-CM | POA: Insufficient documentation

## 2019-09-04 DIAGNOSIS — I10 Essential (primary) hypertension: Secondary | ICD-10-CM | POA: Diagnosis not present

## 2019-09-04 DIAGNOSIS — R42 Dizziness and giddiness: Secondary | ICD-10-CM | POA: Diagnosis present

## 2019-09-04 DIAGNOSIS — I2510 Atherosclerotic heart disease of native coronary artery without angina pectoris: Secondary | ICD-10-CM | POA: Insufficient documentation

## 2019-09-04 DIAGNOSIS — R2689 Other abnormalities of gait and mobility: Secondary | ICD-10-CM | POA: Insufficient documentation

## 2019-09-04 DIAGNOSIS — Z856 Personal history of leukemia: Secondary | ICD-10-CM | POA: Insufficient documentation

## 2019-09-04 DIAGNOSIS — Z951 Presence of aortocoronary bypass graft: Secondary | ICD-10-CM | POA: Diagnosis not present

## 2019-09-04 DIAGNOSIS — E119 Type 2 diabetes mellitus without complications: Secondary | ICD-10-CM | POA: Insufficient documentation

## 2019-09-04 LAB — TROPONIN I (HIGH SENSITIVITY): Troponin I (High Sensitivity): 4 ng/L (ref <18)

## 2019-09-04 LAB — CBC
HCT: 28.3 % — ABNORMAL LOW (ref 36.0–46.0)
Hemoglobin: 9.3 g/dL — ABNORMAL LOW (ref 12.0–15.0)
MCH: 27.4 pg (ref 26.0–34.0)
MCHC: 32.9 g/dL (ref 30.0–36.0)
MCV: 83.5 fL (ref 80.0–100.0)
Platelets: 235 10*3/uL (ref 150–400)
RBC: 3.39 MIL/uL — ABNORMAL LOW (ref 3.87–5.11)
RDW: 13.6 % (ref 11.5–15.5)
WBC: 23.6 10*3/uL — ABNORMAL HIGH (ref 4.0–10.5)
nRBC: 0 % (ref 0.0–0.2)

## 2019-09-04 LAB — BASIC METABOLIC PANEL
Anion gap: 10 (ref 5–15)
BUN: 18 mg/dL (ref 8–23)
CO2: 23 mmol/L (ref 22–32)
Calcium: 9 mg/dL (ref 8.9–10.3)
Chloride: 95 mmol/L — ABNORMAL LOW (ref 98–111)
Creatinine, Ser: 0.58 mg/dL (ref 0.44–1.00)
GFR calc Af Amer: >60 mL/min (ref >60)
GFR calc non Af Amer: >60 mL/min (ref >60)
Glucose, Bld: 260 mg/dL — ABNORMAL HIGH (ref 70–99)
Potassium: 4.3 mmol/L (ref 3.5–5.1)
Sodium: 128 mmol/L — ABNORMAL LOW (ref 135–145)

## 2019-09-04 MED ORDER — AZITHROMYCIN 250 MG PO TABS: ORAL_TABLET | ORAL | 0 refills | Status: DC

## 2019-09-04 MED ORDER — SODIUM CHLORIDE 0.9 % IV SOLN: Freq: Once | INTRAVENOUS | Status: AC

## 2019-09-04 MED ORDER — DIAZEPAM 2 MG PO TABS: 2.0000 mg | ORAL_TABLET | Freq: Three times a day (TID) | ORAL | 0 refills | Status: AC | PRN

## 2019-09-04 NOTE — ED Notes (Signed)
Pt returned from ct

## 2019-09-04 NOTE — ED Triage Notes (Signed)
Pt states intermittent dizziness since Thursday. Has been taking something OTC but it's not working any more. States feels like room is spinning. Vomited yesterday, dry heaves today. A&O, in wheelchair.   Pt states she was seen at [facility] walk-in clinic and they brought her here for a CT because they think she might have had a stroke. Pt states "If I had one I didn't know it." pt has no neuro deficits, speaking in clear complete sentences. No vision problems. Moving all extremities on own.

## 2019-09-04 NOTE — ED Notes (Signed)
MD notified of BP, cleared for discharge

## 2019-09-04 NOTE — ED Provider Notes (Signed)
[facility] Emergency Department Provider Note       Time seen: ----------------------------------------- 9:09 PM on 09/04/2019 -----------------------------------------   I have reviewed the triage vital signs and the nursing notes.  HISTORY   Chief Complaint Dizziness    HPI Cassandra Gentry is a 86 y.o. female with a history of leukemia, coronary artery disease, diabetes, diverticulosis, edema, hyperlipidemia, hypertension who presents to the ED for intermittent dizziness since Thursday.  Patient's been taking something over-the-counter but states is not working anymore.  She feels like the room is spinning.  She vomited yesterday and had dry heaves today.  She had an outpatient CT as well.  Patient states she cannot walk because she has lost her balance.  Past Medical History:  Diagnosis Date  . Chronic lymphocytic leukemia ([place])   . Complication of anesthesia    SICK X 3 DAYS AFTER LAST CATARACT. LM FOR HER TO CALL BACK WITH MORE INFO  . Coronary artery disease   . Diabetes mellitus without complication ([place])   . Diverticulosis   . Dysrhythmia   . Edema    LEFT LEG  . GERD (gastroesophageal reflux disease)   . Hyperlipidemia   . Hypertension   . Thyroid disease     There are no problems to display for this patient.   Past Surgical History:  Procedure Laterality Date  . CATARACT EXTRACTION W/PHACO Left 06/08/2018   Procedure: CATARACT EXTRACTION PHACO AND INTRAOCULAR LENS PLACEMENT (IOC);  Surgeon: Birder Robson, MD;  Location: [facility] ORS;  Service: Ophthalmology;  Laterality: Left;  [place] 02:20 CDE 23.41 Fluid pack lot # [id number]  . CATARACT EXTRACTION W/PHACO Right 07/13/2018   Procedure: CATARACT EXTRACTION PHACO AND INTRAOCULAR LENS PLACEMENT ([place]);  Surgeon: Birder Robson, MD;  Location: [facility] ORS;  Service: Ophthalmology;  Laterality: Right;  [place] 01:18.2 CDE 15.42 Fluid Pack Lot # [id number]  . CORONARY ARTERY BYPASS GRAFT  2000     Allergies Amoxicillin-pot clavulanate, Other, Shellfish allergy, and Simvastatin  Social History Social History   Tobacco Use  . Smoking status: Never Smoker  . Smokeless tobacco: Never Used  Substance Use Topics  . Alcohol use: Never  . Drug use: Not on file    Review of Systems Constitutional: Negative for fever. Cardiovascular: Negative for chest pain. Respiratory: Negative for shortness of breath. Gastrointestinal: Negative for abdominal pain, positive for recent nausea and vomiting Musculoskeletal: Negative for back pain. Skin: Negative for rash. Neurological: Negative for headaches, focal weakness or numbness.  Positive for dizziness, positive for balance disturbance  All systems negative/normal/unremarkable except as stated in the HPI  ____________________________________________   PHYSICAL EXAM:  VITAL SIGNS: ED Triage Vitals  Enc Vitals Group     BP 09/04/19 1624 (!) 161/62     Pulse Rate 09/04/19 1624 62     Resp 09/04/19 1624 16     Temp 09/04/19 1624 98.6 F (37 C)     Temp Source 09/04/19 1624 Oral     SpO2 09/04/19 1624 99 %     Weight 09/04/19 1621 154 lb (69.9 kg)     Height 09/04/19 1621 5\' 6"  (1.676 m)     Head Circumference --      Peak Flow --      Pain Score 09/04/19 1621 4     Pain Loc --      Pain Edu? --      Excl. in [place]? --    Constitutional: Alert and oriented. Well appearing and in no distress.  Eyes: Conjunctivae are normal. Normal extraocular movements. ENT      Head: Normocephalic and atraumatic.      Nose: No congestion/rhinnorhea.      Mouth/Throat: Mucous membranes are moist.      Neck: No stridor. Cardiovascular: Normal rate, regular rhythm. No murmurs, rubs, or gallops. Respiratory: Normal respiratory effort without tachypnea nor retractions. Breath sounds are clear and equal bilaterally. No wheezes/rales/rhonchi. Gastrointestinal: Soft and nontender. Normal bowel sounds Musculoskeletal: Nontender with normal range of  motion in extremities. No lower extremity tenderness nor edema. Neurologic:  Normal speech and language. No gross focal neurologic deficits are appreciated.  Strength, sensation, cranial nerves appear to be normal Skin:  Skin is warm, dry and intact. No rash noted. Psychiatric: Mood and affect are normal. Speech and behavior are normal.  ____________________________________________  EKG: Interpreted by me.  Sinus bradycardia with PACs, left axis deviation, LVH, normal QT  ____________________________________________  ED COURSE:  As part of my medical decision making, I reviewed the following data within the [place] History obtained from family if available, nursing notes, old chart and ekg, as well as notes from prior ED visits. Patient presented for dizziness, we will assess with labs and imaging as indicated at this time.   Procedures  Cassandra Gentry was evaluated in Emergency Department on 09/04/2019 for the symptoms described in the history of present illness. She was evaluated in the context of the global COVID-19 pandemic, which necessitated consideration that the patient might be at risk for infection with the SARS-CoV-2 virus that causes COVID-19. Institutional protocols and algorithms that pertain to the evaluation of patients at risk for COVID-19 are in a state of rapid change based on information released by regulatory bodies including the CDC and federal and state organizations. These policies and algorithms were followed during the patient's care in the ED.  ____________________________________________   LABS (pertinent positives/negatives)  Labs Reviewed  BASIC METABOLIC PANEL - Abnormal; Notable for the following components:      Result Value   Sodium 128 (*)    Chloride 95 (*)    Glucose, Bld 260 (*)    All other components within normal limits  CBC - Abnormal; Notable for the following components:   WBC 23.6 (*)    RBC 3.39 (*)    Hemoglobin 9.3 (*)     HCT 28.3 (*)    All other components within normal limits  URINALYSIS, COMPLETE (UACMP) WITH MICROSCOPIC  CBG MONITORING, ED  TROPONIN I (HIGH SENSITIVITY)  TROPONIN I (HIGH SENSITIVITY)    RADIOLOGY Images were viewed by me  CT head IMPRESSION: 1. No CT evidence for acute intracranial abnormality. 2. Atrophy and mild small vessel ischemic changes of the white matter ____________________________________________   DIFFERENTIAL DIAGNOSIS   Peripheral vertigo, central vertigo, dehydration, electrolyte abnormality, occult infection, anemia  FINAL ASSESSMENT AND PLAN  Dizziness   Plan: The patient had presented for dizziness. Patient's labs did reveal mild leukocytosis with a blood sugar of 260 and mild hyponatremia with chronic leukocytosis. Patient's imaging did not reveal any acute process.  Patient looks well, I do not think she needs an MRI at this time.  We will try antibiotics for her sinuses and perhaps low-dose Valium to take with meclizine.  She was able to ambulate without any dizziness or balance disturbance here.   Laurence Aly, MD    Note: This note was generated in part or whole with voice recognition software. Voice recognition is usually quite accurate  but there are transcription errors that can and very often do occur. I apologize for any typographical errors that were not detected and corrected.     Earleen Newport, MD 09/04/19 [id number]

## 2019-09-04 NOTE — ED Notes (Signed)
Up to stat desk asking about wait time.  Given update

## 2019-09-04 NOTE — ED Notes (Signed)
First Nurse Note: Pt brought from [facility] for evaluation of vertigo. Pt is in NAD at this time.

## 2019-09-04 NOTE — ED Notes (Signed)
Pt to CT

## 2019-09-06 ENCOUNTER — Other Ambulatory Visit: Payer: Self-pay | Admitting: Physician Assistant

## 2019-09-06 DIAGNOSIS — IMO0001 Reserved for inherently not codable concepts without codable children: Secondary | ICD-10-CM

## 2019-09-06 DIAGNOSIS — H9042 Sensorineural hearing loss, unilateral, left ear, with unrestricted hearing on the contralateral side: Secondary | ICD-10-CM

## 2019-09-10 ENCOUNTER — Ambulatory Visit
Admission: RE | Admit: 2019-09-10 | Discharge: 2019-09-10 | Disposition: A | Discharge status: Home / Self Care | Payer: Medicare HMO | Source: Ambulatory Visit | Attending: Physician Assistant | Admitting: Physician Assistant

## 2019-09-10 ENCOUNTER — Other Ambulatory Visit: Payer: Self-pay

## 2019-09-10 DIAGNOSIS — H9042 Sensorineural hearing loss, unilateral, left ear, with unrestricted hearing on the contralateral side: Secondary | ICD-10-CM | POA: Diagnosis present

## 2019-09-10 DIAGNOSIS — IMO0001 Reserved for inherently not codable concepts without codable children: Secondary | ICD-10-CM

## 2019-09-10 MED ORDER — GADOBUTROL 1 MMOL/ML IV SOLN
7.0000 mL | Freq: Once | INTRAVENOUS | Status: AC | PRN
  Administered 2019-09-10: 16:00:00 7 mL via INTRAVENOUS

## 2019-09-13 ENCOUNTER — Other Ambulatory Visit: Payer: Self-pay | Admitting: Otolaryngology

## 2019-09-13 DIAGNOSIS — R42 Dizziness and giddiness: Secondary | ICD-10-CM

## 2019-09-13 DIAGNOSIS — D333 Benign neoplasm of cranial nerves: Secondary | ICD-10-CM

## 2019-11-23 ENCOUNTER — Ambulatory Visit: Payer: Medicare HMO | Attending: Otolaryngology

## 2019-11-23 ENCOUNTER — Other Ambulatory Visit: Payer: Self-pay

## 2019-11-23 DIAGNOSIS — R2681 Unsteadiness on feet: Secondary | ICD-10-CM | POA: Diagnosis present

## 2019-11-23 DIAGNOSIS — R42 Dizziness and giddiness: Secondary | ICD-10-CM | POA: Insufficient documentation

## 2019-11-23 NOTE — Therapy (Signed)
[place] MAIN [facility] SERVICES [address] [place], [place], [address] Phone: [phone]   Fax:  [phone]  Physical Therapy Evaluation  Patient Details  Name: Cassandra Gentry MRN: [id number] Date of Birth: August 01, 1933 Referring Provider (PT): Dr. Pryor Ochoa   Encounter Date: 11/23/2019  PT End of Session - 11/23/19 1204    Visit Number  1    Number of Visits  13    Date for PT Re-Evaluation  02/15/20    Authorization Type  eval: 11/23/19    PT Start Time  1104    PT Stop Time  1204    PT Time Calculation (min)  60 min    Equipment Utilized During Treatment  Gait belt    Activity Tolerance  Patient tolerated treatment well    Behavior During Therapy  [facility] for tasks assessed/performed       Past Medical History:  Diagnosis Date  . Chronic lymphocytic leukemia ([place])   . Complication of anesthesia    SICK X 3 DAYS AFTER LAST CATARACT. LM FOR HER TO CALL BACK WITH MORE INFO  . Coronary artery disease   . Diabetes mellitus without complication ([place])   . Diverticulosis   . Dysrhythmia   . Edema    LEFT LEG  . GERD (gastroesophageal reflux disease)   . Hyperlipidemia   . Hypertension   . Thyroid disease     Past Surgical History:  Procedure Laterality Date  . CATARACT EXTRACTION W/PHACO Left 06/08/2018   Procedure: CATARACT EXTRACTION PHACO AND INTRAOCULAR LENS PLACEMENT (IOC);  Surgeon: Birder Robson, MD;  Location: [facility] ORS;  Service: Ophthalmology;  Laterality: Left;  [place] 02:20 CDE 23.41 Fluid pack lot # [id number]  . CATARACT EXTRACTION W/PHACO Right 07/13/2018   Procedure: CATARACT EXTRACTION PHACO AND INTRAOCULAR LENS PLACEMENT ([place]);  Surgeon: Birder Robson, MD;  Location: [facility] ORS;  Service: Ophthalmology;  Laterality: Right;  [place] 01:18.2 CDE 15.42 Fluid Pack Lot # [id number]  . CORONARY ARTERY BYPASS GRAFT  2000    There were no vitals filed for this visit.   Subjective Assessment - 11/23/19 1203    Subjective   Dizziness    Pertinent History  Cassandra Gentry is a 86 y.o. female with a history of leukemia, coronary artery disease, diabetes, diverticulosis, edema, hyperlipidemia, hypertension who presented to the ED on 09/04/19 for intermittent dizziness since the previous Thursday. Pt reports that the first episode occurred after she was bent over for a prolonged period of time putting dishes in the dishwasher.   Patient had been taking OTC medications without improvement in symptoms. At that time she described the symptoms as feeling like the room was spinning with associated nausea and vomiting. She was having trouble walking due to losing her balance. Patient's labs did reveal mild leukocytosis with a blood sugar of 260 and mild hyponatremia with chronic leukocytosis. Patient's head CT did not reveal any acute process and it was determined that an MRI was not necessary at that time. She was prescribed antibiotics for her sinuses and low-dose Valium to take with meclizine. She had a follow-up appointment with her PCP who advised her to stop her antibiotics. She took meclizine for a couple days and then felt better. She scheduled an appointment with Dr. 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 Per [facility] records plan is to repeat MRI is 6 months given her agae and good hearing on that side. 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 She is scheduled for a VNG study at [facility]. Denies any presyncopal symptoms. History of headaches every few months but denies history of migraines. Pt had COVID in July.    Limitations  Walking    Diagnostic tests  Brain MRI: 1cm L cerebellopontine angle mass    Patient Stated Goals  Improve balance/dizziness    Currently in Pain?  --   Unrelated to current episode         VESTIBULAR AND BALANCE EVALUATION   HISTORY:   Subjective history of current problem: Cassandra Gentry is a 86 y.o. female with a history of leukemia, coronary artery disease, diabetes, diverticulosis, edema, hyperlipidemia, hypertension who presented to the ED on 09/04/19 for intermittent dizziness since the previous Thursday. Pt reports that the first episode occurred after she was bent over for a prolonged period of time putting dishes in the dishwasher.   Patient had been taking OTC medications without improvement in symptoms. At that time she described the symptoms as feeling like the room was spinning with associated nausea and vomiting. She was having trouble walking due to losing her balance. Patient's labs did reveal mild leukocytosis with a blood sugar of 260 and mild hyponatremia with chronic leukocytosis. Patient's head CT did not reveal any acute process and it was determined that an MRI was not necessary at that time. She was prescribed antibiotics for her sinuses and low-dose Valium to take with meclizine. She had a follow-up appointment with her PCP who advised her to stop her antibiotics. She took meclizine for a couple days and then felt better. She scheduled an appointment with Dr. 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 Per [facility] records plan is to repeat MRI is 6 months given her agae and good hearing on that side. 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 She is scheduled for a VNG study at [facility]. Denies any presyncopal symptoms. History of headaches every few months but denies history of migraines. Pt had COVID in July.  Description of dizziness: (vertigo, unsteadiness, lightheadedness, falling, general unsteadiness, whoozy, swimmy-headed sensation, aural fullness) vertigo Frequency: Prior to this episode previous occurrence was 4 years ago  Duration: Intermittent but lasting  for hours at a time.  Symptom nature: (motion provoked, positional, spontaneous, constant, variable, intermittent) Pt unsure if it is related to movement. It sounds like it was occurring at rest during the most recent episode.   Provocative Factors: Cassandra Easing Factors: Meclizine, dramamine  Progression of symptoms: (better, worse, no change since onset) better History of similar episodes: Last episode 4 years ago.   Falls (yes/no): Yes Number of falls in past 6 months: One  Prior Functional Level: Independent and cares for husband who has PD.  Auditory complaints (tinnitus, pain, drainage, hearing loss, aural fullness): Denies tinnitus, aural fullness, drainage, or pain. Hx of high frequency bilateral SNHL, worse on the left Vision (diplopia, visual field loss, recent changes, last eye exam): No changes, Hx of cataract removal, wears corrective lenses (next eye exam scheduled for May)  Red Flags: (dysarthria, dysphagia, drop attacks, bowel and bladder changes, recent weight loss/gain) History of CLL but otherwise review of systems negative for red flags.     EXAMINATION  POSTURE: WNL  NEUROLOGICAL SCREEN: (  2+ unless otherwise noted.) N=normal  Ab=abnormal  Level Dermatome R L Myotome R L Reflex R L  C3 Anterior Neck N N Sidebend C2-3 N N Jaw CN V    C4 Top of Shoulder N N Shoulder Shrug C4 N N Hoffman's UMN    C5 Lateral Upper Arm N N Shoulder ABD C4-5 N N Biceps C5-6    C6 Lateral Arm/ Thumb N N Arm Flex/ Wrist Ext C5-6 N N Brachiorad. C5-6    C7 Middle Finger N N Arm Ext//Wrist Flex C6-7 N N Triceps C7    C8 4th & 5th Finger N N Flex/ Ext Carpi Ulnaris C8 N N Patellar (L3-4)    T1 Medial Arm N N Interossei T1 N N Gastrocnemius    L2 Medial thigh/groin N N Illiopsoas (L2-3) N N     L3 Lower thigh/med.knee N N Quadriceps (L3-4) N N     L4 Medial leg/lat thigh N N Tibialis Ant (L4-5) N N     L5 Lat. leg & dorsal foot N N EHL (L5) N N     S1 post/lat foot/thigh/leg N N  Gastrocnemius (S1-2) N N     S2 Post./med. thigh & leg N N Hamstrings (L4-S3) N N       Cranial Nerves Visual acuity and visual fields are intact  Extraocular muscles are intact  Facial sensation is intact bilaterally  Facial strength is intact bilaterally  Hearing is normal as tested by gross conversation, history of high frequency SNHL Palate elevates midline, normal phonation  Shoulder shrug strength is intact  Tongue protrudes midline    SOMATOSENSORY:         Sensation           Intact      Diminished         Absent  Light touch Normal      COORDINATION: Finger to Nose: Normal Heel to Shin: Normal Pronator Drift: Negative Rapid Alternating Movements: Normal Finger to Thumb Opposition: Normal  MUSCULOSKELETAL SCREEN: Cervical Spine ROM: WFL and painless in all planes. No gross deficits identified   ROM: WNL  MMT: WNL  Functional Mobility: Independent for transfers and ambulation without assistive device   Gait: Scanning of visual environment with gait is: slightly diminished spontaneous head turns during gait   POSTURAL CONTROL TESTS:   Clinical Test of Sensory Interaction for Balance    (CTSIB): Deferred    OCULOMOTOR / VESTIBULAR TESTING:  Oculomotor Exam- Room Light  Findings Comments  Ocular Alignment normal   Ocular ROM normal   Spontaneous Nystagmus normal   Gaze-Holding Nystagmus abnormal Appears to have a L horizontal beating nystagmus with L mid-range gaze but difficult to assess  End-Gaze Nystagmus normal   Vergence (normal 2-3") not examined   Smooth Pursuit abnormal   Cross-Cover Test not examined   Saccades normal   VOR Cancellation normal   Left Head Impulse abnormal Small corrective saccade  Right Head Impulse abnormal Small corrective saccade  Static Acuity not examined   Dynamic Acuity not examined     Oculomotor Exam- Fixation Suppressed  Findings Comments  Ocular Alignment normal   Spontaneous Nystagmus normal   Gaze-Holding  Nystagmus abnormal R beating horizontal nystagmus with R gaze  End-Gaze Nystagmus See above   Head Shaking Nystagmus normal   Pressure-Induced Nystagmus not examined   Hyperventilation Induced Nystagmus not examined   Skull Vibration Induced Nystagmus not examined     BPPV TESTS:  Symptoms Duration Intensity Nystagmus  L Dix-Hallpike None   None  R Dix-Hallpike None   None  L Head Roll None   None  R Head Roll None   None  L Sidelying Test      R Sidelying Test        FUNCTIONAL OUTCOME MEASURES   Results Comments  FGA 26/30 Higher level balance deficits  ABC Scale 58.8% Below cut-off  DHI 10/100 Low perception of disability          [facility] PT Assessment - 11/23/19 1155      Assessment   Medical Diagnosis  Dizziness    Referring Provider (PT)  Dr. Pryor Ochoa    Onset Date/Surgical Date  09/01/19    Hand Dominance  Right    Next MD Visit  Not reported    Prior Therapy  Not for this issue      Precautions   Precautions  Fall      Restrictions   Weight Bearing Restrictions  No      Balance Screen   Has the patient fallen in the past 6 months  Yes    How many times?  one    Has the patient had a decrease in activity level because of a fear of falling?   No    Is the patient reluctant to leave their home because of a fear of falling?   No      Home Film/video editor residence    Living Arrangements  Spouse/significant other    Available Help at Discharge  Family    Type of [place]  One level    [place]  None      Prior Function   Level of Independence  Independent    Vocation  Retired    Arboriculturist    Leisure  Read, watch TV      Cognition   Overall Cognitive Status  Within Functional Limits for tasks assessed      Functional Gait  Assessment   Gait assessed   Yes    Gait Level Surface  Walks 20 ft in less than 5.5 sec, no assistive devices, good  speed, no evidence for imbalance, normal gait pattern, deviates no more than 6 in outside of the 12 in walkway width.    Change in Gait Speed  Able to smoothly change walking speed without loss of balance or gait deviation. Deviate no more than 6 in outside of the 12 in walkway width.    Gait with Horizontal Head Turns  Performs head turns smoothly with slight change in gait velocity (eg, minor disruption to smooth gait path), deviates 6-10 in outside 12 in walkway width, or uses an assistive device.    Gait with Vertical Head Turns  Performs task with slight change in gait velocity (eg, minor disruption to smooth gait path), deviates 6 - 10 in outside 12 in walkway width or uses assistive device    Gait and Pivot Turn  Pivot turns safely within 3 sec and stops quickly with no loss of balance.    Step Over Obstacle  Is able to step over one shoe box (4.5 in total height) without changing gait speed. No evidence of imbalance.    Gait with Narrow Base of Support  Is able to ambulate for 10 steps heel to toe with no staggering.    Gait with Eyes Closed  Walks 20 ft, uses assistive device, slower speed, mild gait deviations, deviates 6-10 in outside 12 in walkway width. Ambulates 20 ft in less than 9 sec but greater than 7 sec.    Ambulating Backwards  Walks 20 ft, no assistive devices, good speed, no evidence for imbalance, normal gait    Steps  Alternating feet, no rail.    Total Score  26                Objective measurements completed on examination: See above findings.              PT Education - 11/23/19 1204    Education Details  Plan of care    Person(s) Educated  Patient    Methods  Explanation    Comprehension  Verbalized understanding       PT Short Term Goals - 11/23/19 1356      PT SHORT TERM GOAL #1   Title  Pt will be independent with HEP in order to improve strength and balance in order to decrease fall risk and improve function at home.    Time  6     Period  Weeks    Status  New    Target Date  01/04/20        PT Long Term Goals - 11/23/19 1358      PT LONG TERM GOAL #1   Title  Pt will improve FGA by at least 3 points in order to demonstrate clinically significant improvement in balance and decreased risk for falls.    Baseline  11/23/19: 26/30    Time  12    Period  Weeks    Status  New    Target Date  02/15/20      PT LONG TERM GOAL #2   Title  Pt will improve ABC by at least 13% in order to demonstrate clinically significant improvement in balance confidence.    Baseline  11/23/19: 58.8%    Time  12    Period  Weeks    Status  New    Target Date  02/15/20      PT LONG TERM GOAL #3   Title  Pt will report no further episodes of vertigo in order to resume full symptom-free function at home    Time  12    Period  Weeks    Status  New    Target Date  02/15/20             Plan - 11/23/19 1204    Clinical Impression Statement  Pt is a pleasant 86 year-old female referred for dizziness. She had an acute bout of severe vertigo which started 09/01/19 and resulted in ED visit on 09/04/19. 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 Pt reports that her symptoms have resolved completely and she has not had vertigo in multiple months. She reports a previous similar episode 4 years ago and then another decades ago. PT examination is relatively unrevealing. She does appear to have some R horizontal beating nystagmus with rightward gaze using fixation suppression infrared goggles and this is likely due to her L schwannoma. All BPPV testing is negative on this date and based on her history of spontaneous vertigo lasting for hours does not appear to be BPPV. Unclear etiology of her initial vertigo episode. Given that it spontaneously resolved rather quickly it seems like it would not be related to the schwannoma. Pt denies tinnitus but given recurrent  episodes separated by years could  be a Menire's variant. Pt also could have suffered a vestibular neuritis at the time of onset. She does have some higher level balance deficits and would benefit from therapy.    Personal Factors and Comorbidities  Age;Comorbidity 3+    Comorbidities  arthritis, CLL, CAD    Examination-Activity Limitations  Locomotion Level    Examination-Participation Restrictions  Community Activity;Shop    Stability/Clinical Decision Making  Stable/Uncomplicated    Clinical Decision Making  Low    Rehab Potential  Good    PT Frequency  1x / week    PT Duration  12 weeks    PT Treatment/Interventions  ADLs/Self Care Home Management;Aquatic Therapy;Biofeedback;Canalith Repostioning;Cryotherapy;Electrical Stimulation;Iontophoresis 4mg /ml Dexamethasone;Moist Heat;Traction;Ultrasound;DME Instruction;Gait training;Stair training;Functional mobility training;Therapeutic activities;Therapeutic exercise;Balance training;Neuromuscular re-education;Cognitive remediation;Patient/family education;Manual techniques;Passive range of motion;Dry needling;Vestibular;Joint Manipulations;Spinal Manipulations    PT Next Visit Plan  BERG, 5TSTS, TUG, initiate HEP, initiate balance/strengthening;    PT Home Exercise Plan  None currently    Consulted and Agree with Plan of Care  Patient       Patient will benefit from skilled therapeutic intervention in order to improve the following deficits and impairments:  Dizziness, Decreased balance  Visit Diagnosis: Dizziness and giddiness  Unsteadiness on feet     Problem List There are no problems to display for this patient.  Phillips Grout PT, DPT, GCS  Cassandra Gentry 11/23/2019, 3:05 PM  [place] MAIN [facility] SERVICES [address] [place], [place], [address] Phone: [phone]   Fax:  [phone]  Name: Cassandra Gentry MRN: [id number] Date of Birth: Jan 07, 1933

## 2019-11-29 ENCOUNTER — Ambulatory Visit: Payer: Medicare HMO

## 2019-12-09 ENCOUNTER — Ambulatory Visit: Payer: Medicare HMO | Attending: Otolaryngology

## 2019-12-09 ENCOUNTER — Other Ambulatory Visit: Payer: Self-pay

## 2019-12-09 DIAGNOSIS — R2681 Unsteadiness on feet: Secondary | ICD-10-CM | POA: Diagnosis present

## 2019-12-09 DIAGNOSIS — R42 Dizziness and giddiness: Secondary | ICD-10-CM | POA: Diagnosis not present

## 2019-12-09 NOTE — Therapy (Signed)
[place] MAIN [facility] SERVICES [address] [place], [place], [address] Phone: [phone]   Fax:  [phone]  Physical Therapy Treatment  Patient Details  Name: Cassandra Gentry MRN: [id number] Date of Birth: 1932-09-22 Referring Provider (PT): Dr. Pryor Ochoa   Encounter Date: 12/09/2019  PT End of Session - 12/09/19 1110    Visit Number  2    Number of Visits  13    Date for PT Re-Evaluation  02/15/20    Authorization Type  eval: 11/23/19    PT Start Time  1100    PT Stop Time  1140    PT Time Calculation (min)  40 min    Equipment Utilized During Treatment  Gait belt    Activity Tolerance  Patient tolerated treatment well    Behavior During Therapy  [facility] for tasks assessed/performed       Past Medical History:  Diagnosis Date  . Chronic lymphocytic leukemia ([place])   . Complication of anesthesia    SICK X 3 DAYS AFTER LAST CATARACT. LM FOR HER TO CALL BACK WITH MORE INFO  . Coronary artery disease   . Diabetes mellitus without complication ([place])   . Diverticulosis   . Dysrhythmia   . Edema    LEFT LEG  . GERD (gastroesophageal reflux disease)   . Hyperlipidemia   . Hypertension   . Thyroid disease     Past Surgical History:  Procedure Laterality Date  . CATARACT EXTRACTION W/PHACO Left 06/08/2018   Procedure: CATARACT EXTRACTION PHACO AND INTRAOCULAR LENS PLACEMENT (IOC);  Surgeon: Birder Robson, MD;  Location: [facility] ORS;  Service: Ophthalmology;  Laterality: Left;  [place] 02:20 CDE 23.41 Fluid pack lot # [id number]  . CATARACT EXTRACTION W/PHACO Right 07/13/2018   Procedure: CATARACT EXTRACTION PHACO AND INTRAOCULAR LENS PLACEMENT ([place]);  Surgeon: Birder Robson, MD;  Location: [facility] ORS;  Service: Ophthalmology;  Laterality: Right;  [place] 01:18.2 CDE 15.42 Fluid Pack Lot # [id number]  . CORONARY ARTERY BYPASS GRAFT  2000    There were no vitals filed for this visit.  Subjective Assessment - 12/09/19 1102    Subjective  Pt reports  that she is doing well today. She denies any further episodes of dizziness/vertigo. She had an initial consult with rheumatology which she reports went well. No specific questions or concerns recently.    Pertinent History  [name] is a 86 y.o. female with a history of leukemia, coronary artery disease, diabetes, diverticulosis, edema, hyperlipidemia, hypertension who presented to the ED on 09/04/19 for intermittent dizziness since the previous Thursday. Pt reports that the first episode occurred after she was bent over for a prolonged period of time putting dishes in the dishwasher.   Patient had been taking OTC medications without improvement in symptoms. At that time she described the symptoms as feeling like the room was spinning with associated nausea and vomiting. She was having trouble walking due to losing her balance. Patient's labs did reveal mild leukocytosis with a blood sugar of 260 and mild hyponatremia with chronic leukocytosis. Patient's head CT did not reveal any acute process and it was determined that an MRI was not necessary at that time. She was prescribed antibiotics for her sinuses and low-dose Valium to take with meclizine. She had a follow-up appointment with her PCP who advised her to stop her antibiotics. She took meclizine for a couple days and then felt better. She scheduled an appointment with Dr. 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 Per [facility] records plan is to repeat MRI is 6 months given her agae and good hearing on that side. 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 She is scheduled for a VNG study at [facility]. Denies any presyncopal symptoms. History of headaches every few months but denies history of migraines. Pt had COVID in July.    Limitations  Walking    Diagnostic tests  Brain MRI: 1cm L cerebellopontine  angle mass    Patient Stated Goals  Improve balance/dizziness    Currently in Pain?  Yes   Pt with chronic pain, unrelated to current episode       TREATMENT   Neuromuscular Re-education  Performed outcome measures with patient including: 5TSTS: 18.5s TUG: 9.6s BERG: 54/56; 50m gait speed: self-selected: 10.4s =  0.96 m/s; fastest: 7.8s = 1.28 m/s  mCTSIB: Condition 1: 30s Condition 2: 30s Condition 3: 30s Condition 4: 11.3s  6" alternating toe taps x 10 bilateral; Airex 6" alternating toe taps x 10 bilateral;   Ther-ex  Seated marches with green tband 3s hold x 10 bilateral; Seated clams with green tband 3s hold x 10 bilateral; Seated adductor ball squeeze 3s hold x 10 bilateral; Pt issued written HEP with extensive education by therapist about how to perform correctly/safely;   Pt educated throughout session about proper posture and technique with exercises. Improved exercise technique, movement at target joints, use of target muscles after min to mod verbal, visual, tactile cues.    Updated additional outcome measures with patient. Her 5TSTS is 18.5s which indicates increased LE weakness. Her TUG and BERG are 9.5s and 54/56 respectively which is WNL. Pt was provided with HEP today for LE strengthening as well as balance. Avoided sit to stand due to increase in knee pain so instead issued standing mini squats. Pt encouraged to follow-up as scheduled. Pt will benefit from PT services to address deficits in strength, balance, and mobility in order to return to full function at home.                   PT Education - 12/09/19 1540    Education Details  HEP    Person(s) Educated  Patient    Methods  Explanation;Demonstration;Handout    Comprehension  Verbalized understanding       PT Short Term Goals - 11/23/19 1356      PT SHORT TERM GOAL #1   Title  Pt will be independent with HEP in order to improve strength and balance in order to decrease fall risk  and improve function at home.    Time  6    Period  Weeks    Status  New    Target Date  01/04/20        PT Long Term Goals - 11/23/19 1358      PT LONG TERM GOAL #1   Title  Pt will improve FGA by at least 3 points in order to demonstrate clinically significant improvement in balance and decreased risk for falls.    Baseline  11/23/19: 26/30    Time  12    Period  Weeks    Status  New    Target Date  02/15/20      PT LONG TERM GOAL #2   Title  Pt will improve ABC by at least 13% in order to demonstrate clinically  significant improvement in balance confidence.    Baseline  11/23/19: 58.8%    Time  12    Period  Weeks    Status  New    Target Date  02/15/20      PT LONG TERM GOAL #3   Title  Pt will report no further episodes of vertigo in order to resume full symptom-free function at home    Time  12    Period  Weeks    Status  New    Target Date  02/15/20            Plan - 12/09/19 1540    Clinical Impression Statement  Updated additional outcome measures with patient. Her 5TSTS is 18.5s which indicates increased LE weakness. Her TUG and BERG are 9.5s and 54/56 respectively which is WNL. Pt was provided with HEP today for LE strengthening as well as balance. Avoided sit to stand due to increase in knee pain so instead issued standing mini squats. Pt encouraged to follow-up as scheduled. Pt will benefit from PT services to address deficits in strength, balance, and mobility in order to return to full function at home.    Personal Factors and Comorbidities  Age;Comorbidity 3+    Comorbidities  arthritis, CLL, CAD    Examination-Activity Limitations  Locomotion Level    Examination-Participation Restrictions  Community Activity;Shop    Stability/Clinical Decision Making  Stable/Uncomplicated    Rehab Potential  Good    PT Frequency  1x / week    PT Duration  12 weeks    PT Treatment/Interventions  ADLs/Self Care Home Management;Aquatic Therapy;Biofeedback;Canalith  Repostioning;Cryotherapy;Electrical Stimulation;Iontophoresis 4mg /ml Dexamethasone;Moist Heat;Traction;Ultrasound;DME Instruction;Gait training;Stair training;Functional mobility training;Therapeutic activities;Therapeutic exercise;Balance training;Neuromuscular re-education;Cognitive remediation;Patient/family education;Manual techniques;Passive range of motion;Dry needling;Vestibular;Joint Manipulations;Spinal Manipulations    PT Next Visit Plan  Continue balance/strengthening    PT Home Exercise Plan  Medbridge Access Code: [email or web address]    Consulted and Agree with Plan of Care  Patient       Patient will benefit from skilled therapeutic intervention in order to improve the following deficits and impairments:  Dizziness, Decreased balance  Visit Diagnosis: Dizziness and giddiness  Unsteadiness on feet     Problem List There are no problems to display for this patient.  Phillips Grout PT, DPT, GCS  Evett Kassa 12/09/2019, 3:54 PM  [place] MAIN [facility] SERVICES [address] [place], [place], [address] Phone: [phone]   Fax:  [phone]  Name: KOURTNEY KNAPPER MRN: [id number] Date of Birth: 25-Dec-1932

## 2019-12-09 NOTE — Patient Instructions (Signed)
Access Code: [email or web address] URL: [email or web address] Date: 12/09/2019 Prepared by: Roxana Hires  Exercises Seated Hip Abduction with Resistance - 1 x daily - 7 x weekly - 2 sets - 10 reps - 3s hold Seated March with Resistance - 1 x daily - 7 x weekly - 2 sets - 10 reps - 3s hold Seated Hip Adduction Squeeze with Ball - 1 x daily - 7 x weekly - 2 sets - 10 reps - 3s hold Mini Squat with Counter Support - 1 x daily - 7 x weekly - 2 sets - 10 reps Tandem Walking Next to Counter - 1 x daily - 7 x weekly - 3 reps - 1 minute hold Standing Single Leg Stance with Counter Support - 1 x daily - 7 x weekly - 2 x 20s on each leg hold

## 2019-12-16 ENCOUNTER — Ambulatory Visit: Payer: Medicare HMO

## 2019-12-23 ENCOUNTER — Ambulatory Visit: Payer: Medicare HMO

## 2020-03-12 ENCOUNTER — Other Ambulatory Visit: Payer: Self-pay

## 2020-03-12 ENCOUNTER — Ambulatory Visit
Admission: RE | Admit: 2020-03-12 | Discharge: 2020-03-12 | Disposition: A | Discharge status: Home / Self Care | Payer: Medicare HMO | Source: Ambulatory Visit | Attending: Otolaryngology | Admitting: Otolaryngology

## 2020-03-12 DIAGNOSIS — D333 Benign neoplasm of cranial nerves: Secondary | ICD-10-CM | POA: Insufficient documentation

## 2020-03-12 DIAGNOSIS — R42 Dizziness and giddiness: Secondary | ICD-10-CM | POA: Insufficient documentation

## 2020-03-12 MED ORDER — GADOBUTROL 1 MMOL/ML IV SOLN
7.0000 mL | Freq: Once | INTRAVENOUS | Status: AC | PRN
  Administered 2020-03-12: 7 mL via INTRAVENOUS

## 2020-03-13 ENCOUNTER — Other Ambulatory Visit: Payer: Self-pay | Admitting: Otolaryngology

## 2020-03-13 DIAGNOSIS — D333 Benign neoplasm of cranial nerves: Secondary | ICD-10-CM

## 2020-10-03 ENCOUNTER — Inpatient Hospital Stay: Payer: Medicare HMO | Attending: Oncology | Admitting: Oncology

## 2020-10-03 ENCOUNTER — Encounter (INDEPENDENT_AMBULATORY_CARE_PROVIDER_SITE_OTHER): Payer: Self-pay

## 2020-10-03 ENCOUNTER — Encounter: Payer: Self-pay | Admitting: Oncology

## 2020-10-03 ENCOUNTER — Inpatient Hospital Stay: Payer: Medicare HMO

## 2020-10-03 VITALS — BP 138/55 | HR 63 | Temp 98.3°F | Resp 18 | Ht 66.0 in | Wt 155.5 lb

## 2020-10-03 DIAGNOSIS — Z794 Long term (current) use of insulin: Secondary | ICD-10-CM | POA: Insufficient documentation

## 2020-10-03 DIAGNOSIS — C9110 Chronic lymphocytic leukemia of B-cell type not having achieved remission: Secondary | ICD-10-CM | POA: Diagnosis not present

## 2020-10-03 DIAGNOSIS — K5790 Diverticulosis of intestine, part unspecified, without perforation or abscess without bleeding: Secondary | ICD-10-CM | POA: Insufficient documentation

## 2020-10-03 DIAGNOSIS — D508 Other iron deficiency anemias: Secondary | ICD-10-CM | POA: Insufficient documentation

## 2020-10-03 DIAGNOSIS — Z7189 Other specified counseling: Secondary | ICD-10-CM | POA: Insufficient documentation

## 2020-10-03 DIAGNOSIS — Z7982 Long term (current) use of aspirin: Secondary | ICD-10-CM | POA: Diagnosis not present

## 2020-10-03 DIAGNOSIS — Z79899 Other long term (current) drug therapy: Secondary | ICD-10-CM | POA: Insufficient documentation

## 2020-10-03 DIAGNOSIS — D649 Anemia, unspecified: Secondary | ICD-10-CM | POA: Insufficient documentation

## 2020-10-03 DIAGNOSIS — I2510 Atherosclerotic heart disease of native coronary artery without angina pectoris: Secondary | ICD-10-CM | POA: Diagnosis not present

## 2020-10-03 DIAGNOSIS — E109 Type 1 diabetes mellitus without complications: Secondary | ICD-10-CM | POA: Insufficient documentation

## 2020-10-03 DIAGNOSIS — D72820 Lymphocytosis (symptomatic): Secondary | ICD-10-CM

## 2020-10-03 DIAGNOSIS — E871 Hypo-osmolality and hyponatremia: Secondary | ICD-10-CM | POA: Insufficient documentation

## 2020-10-03 DIAGNOSIS — R5383 Other fatigue: Secondary | ICD-10-CM | POA: Diagnosis not present

## 2020-10-03 LAB — TECHNOLOGIST SMEAR REVIEW
Plt Morphology: ADEQUATE
WBC Morphology: ABNORMAL

## 2020-10-03 LAB — LACTATE DEHYDROGENASE: LDH: 204 U/L — ABNORMAL HIGH (ref 98–192)

## 2020-10-03 LAB — CBC WITH DIFFERENTIAL/PLATELET
Abs Immature Granulocytes: 0.11 10*3/uL — ABNORMAL HIGH (ref 0.00–0.07)
Basophils Absolute: 0.1 10*3/uL (ref 0.0–0.1)
Basophils Relative: 1 %
Eosinophils Absolute: 0.4 10*3/uL (ref 0.0–0.5)
Eosinophils Relative: 2 %
HCT: 25.6 % — ABNORMAL LOW (ref 36.0–46.0)
Hemoglobin: 8.3 g/dL — ABNORMAL LOW (ref 12.0–15.0)
Immature Granulocytes: 1 %
Lymphocytes Relative: 74 %
Lymphs Abs: 16.1 10*3/uL — ABNORMAL HIGH (ref 0.7–4.0)
MCH: 23.3 pg — ABNORMAL LOW (ref 26.0–34.0)
MCHC: 32.4 g/dL (ref 30.0–36.0)
MCV: 71.9 fL — ABNORMAL LOW (ref 80.0–100.0)
Monocytes Absolute: 0.6 10*3/uL (ref 0.1–1.0)
Monocytes Relative: 3 %
Neutro Abs: 4.1 10*3/uL (ref 1.7–7.7)
Neutrophils Relative %: 19 %
Platelets: 350 10*3/uL (ref 150–400)
RBC: 3.56 MIL/uL — ABNORMAL LOW (ref 3.87–5.11)
RDW: 17.9 % — ABNORMAL HIGH (ref 11.5–15.5)
Smear Review: NORMAL
WBC: 21.5 10*3/uL — ABNORMAL HIGH (ref 4.0–10.5)
nRBC: 0 % (ref 0.0–0.2)

## 2020-10-03 LAB — COMPREHENSIVE METABOLIC PANEL
ALT: 23 U/L (ref 0–44)
AST: 28 U/L (ref 15–41)
Albumin: 3.8 g/dL (ref 3.5–5.0)
Alkaline Phosphatase: 76 U/L (ref 38–126)
Anion gap: 10 (ref 5–15)
BUN: 22 mg/dL (ref 8–23)
CO2: 23 mmol/L (ref 22–32)
Calcium: 8.8 mg/dL — ABNORMAL LOW (ref 8.9–10.3)
Chloride: 93 mmol/L — ABNORMAL LOW (ref 98–111)
Creatinine, Ser: 0.89 mg/dL (ref 0.44–1.00)
GFR, Estimated: >60 mL/min (ref >60)
Glucose, Bld: 207 mg/dL — ABNORMAL HIGH (ref 70–99)
Potassium: 4.2 mmol/L (ref 3.5–5.1)
Sodium: 126 mmol/L — ABNORMAL LOW (ref 135–145)
Total Bilirubin: 0.8 mg/dL (ref 0.3–1.2)
Total Protein: 6.9 g/dL (ref 6.5–8.1)

## 2020-10-03 LAB — FERRITIN: Ferritin: 13 ng/mL (ref 11–307)

## 2020-10-03 LAB — HEPATITIS PANEL, ACUTE
HCV Ab: NONREACTIVE
Hep A IgM: NONREACTIVE
Hep B C IgM: NONREACTIVE
Hepatitis B Surface Ag: NONREACTIVE

## 2020-10-03 LAB — RETIC PANEL
Immature Retic Fract: 15 % (ref 2.3–15.9)
RBC.: 3.46 MIL/uL — ABNORMAL LOW (ref 3.87–5.11)
Retic Count, Absolute: 63.3 10*3/uL (ref 19.0–186.0)
Retic Ct Pct: 1.8 % (ref 0.4–3.1)
Reticulocyte Hemoglobin: 28.1 pg (ref >27.9)

## 2020-10-03 LAB — HIV ANTIBODY (ROUTINE TESTING W REFLEX): HIV Screen 4th Generation wRfx: NONREACTIVE

## 2020-10-03 LAB — IRON AND TIBC
Iron: 22 ug/dL — ABNORMAL LOW (ref 28–170)
Saturation Ratios: 5 % — ABNORMAL LOW (ref 10.4–31.8)
TIBC: 475 ug/dL — ABNORMAL HIGH (ref 250–450)
UIBC: 453 ug/dL

## 2020-10-03 LAB — VITAMIN B12: Vitamin B-12: 1035 pg/mL — ABNORMAL HIGH (ref 180–914)

## 2020-10-03 LAB — FOLATE: Folate: 40 ng/mL (ref >5.9)

## 2020-10-03 NOTE — Progress Notes (Signed)
Patient here to establish care for CLL. ?

## 2020-10-03 NOTE — Progress Notes (Signed)
Hematology/Oncology Consult note [facility] [phone] [phone]   Patient Care Team: Baxter Hire, MD as PCP - General (Internal Medicine)  REFERRING PROVIDER: Lonia Farber, MD  CHIEF COMPLAINTS/REASON FOR VISIT:  Evaluation of CLL and anemia   HISTORY OF PRESENTING ILLNESS:   Cassandra Gentry is a  87 y.o.  female with PMH listed below was seen in consultation at the request of  Lonia Farber, MD  for evaluation of CLL and anemia  Reports a diagnosis of CLL and she previously follows up with [facility] oncology.  She participated in a clinical trial.  Patient lost to follow-up since the start of COVID-19 pandemic. Patient denies ever being on any treatment for CLL.  Denies any constitutional symptoms 05/04/2020, CBC showed hemoglobin 8.6, hematocrit 27, elevated white count 22.8, MCV 75.6, Patient was referred to hematology to establish care . She follows up with endocrinology for type 1 diabetes and was seen by Dr. Honor Junes on 09/12/2020.  Denies any change of bowel habit, bloody or black stool.  She reports intermittent rectal bleeding which she attributes to hemorrhoid bleeding.  # I obtained and reviewed oncology medical records at [facility]. Last seen by Dr. Dorothea Ogle on 12/01/2017. a. Incidentally noted to have a mild leukocytosis on a routine annual examination, 11/09.  b. Initial hematologic evaluation 07/22/08 shows WBC 13.5, HGB 12.5, PLT 214. Peripheral blood flow cytometry shows CD5, CD19, CD23, kappa restricted monoclonal B-cell population comprising 38% of peripheral blood events, CD38 negative, consistent with a diagnosis of chronic lymphocytic leukemia.  c. 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   Review of Systems   Constitutional: Positive for fatigue. Negative for appetite change, chills and fever.  HENT:   Negative for hearing loss and voice change.   Eyes: Negative for eye problems.  Respiratory: Negative for chest tightness and cough.   Cardiovascular: Negative for chest pain.  Gastrointestinal: Negative for abdominal distention, abdominal pain and blood in stool.  Endocrine: Negative for hot flashes.  Genitourinary: Negative for difficulty urinating and frequency.   Musculoskeletal: Negative for arthralgias.  Skin: Negative for itching and rash.  Neurological: Negative for extremity weakness.  Hematological: Negative for adenopathy.  Psychiatric/Behavioral: Negative for confusion.    MEDICAL HISTORY:  Past Medical History:  Diagnosis Date  . Chronic lymphocytic leukemia ([place])   . Complication of anesthesia    SICK X 3 DAYS AFTER LAST CATARACT. LM FOR HER TO CALL BACK WITH MORE INFO  . Coronary artery disease   . Diabetes mellitus without complication ([place])   . Diverticulosis   . Dysrhythmia   . Edema    LEFT LEG  . GERD (gastroesophageal reflux disease)   . Hyperlipidemia   . Hypertension   . Thyroid disease     SURGICAL HISTORY: Past Surgical History:  Procedure Laterality Date  . CATARACT EXTRACTION W/PHACO Left 06/08/2018   Procedure: CATARACT EXTRACTION PHACO AND INTRAOCULAR LENS PLACEMENT (IOC);  Surgeon: Birder Robson, MD;  Location: [facility] ORS;  Service: Ophthalmology;  Laterality: Left;  [place] 02:20 CDE 23.41 Fluid pack lot # [id number]  . CATARACT EXTRACTION W/PHACO Right 07/13/2018   Procedure: CATARACT EXTRACTION PHACO AND INTRAOCULAR LENS PLACEMENT ([place]);  Surgeon: Birder Robson, MD;  Location: [facility] ORS;  Service: Ophthalmology;  Laterality: Right;  [place] 01:18.2 CDE 15.42 Fluid Pack Lot # [id number]  .  CORONARY ARTERY BYPASS GRAFT  2000    SOCIAL HISTORY: Social History   Socioeconomic History  . Marital status: Married    Spouse name: Not on file  . Number of  children: Not on file  . Years of education: Not on file  . Highest education level: Not on file  Occupational History  . Not on file  Tobacco Use  . Smoking status: Never Smoker  . Smokeless tobacco: Never Used  Vaping Use  . Vaping Use: Never used  Substance and Sexual Activity  . Alcohol use: Never  . Drug use: Never  . Sexual activity: Not on file  Other Topics Concern  . Not on file  Social History Narrative  . Not on file   Social Determinants of Health   Financial Resource Strain: Not on file  Food Insecurity: Not on file  Transportation Needs: Not on file  Physical Activity: Not on file  Stress: Not on file  Social Connections: Not on file  Intimate Partner Violence: Not on file    FAMILY HISTORY: Family History  Problem Relation Age of Onset  . Parkinson's disease Mother   . Heart disease Father   . Stomach cancer Maternal Uncle     ALLERGIES:  is allergic to amoxicillin-pot clavulanate, other, shellfish allergy, and simvastatin.  MEDICATIONS:  Current Outpatient Medications  Medication Sig Dispense Refill  . acetaminophen (TYLENOL) 500 MG tablet Take 1,000 mg by mouth every 6 (six) hours as needed for moderate pain or headache.     . alendronate (FOSAMAX) 70 MG tablet Take by mouth. Take 1 tablet (70 mg total) by mouth every 7 (seven) days Take with a full glass of water. Do not lie down for the next 30 min.    . Ascorbic Acid (VITAMIN C) 1000 MG tablet Take 1,000 mg by mouth daily after supper.     Marland Kitchen aspirin 325 MG tablet Take 325 mg by mouth every evening.     . chlorthalidone (HYGROTON) 25 MG tablet Take 12.5 mg by mouth every Monday, Wednesday, and Friday.     . cholecalciferol (VITAMIN D) 1000 units tablet Take 1,000 Units by mouth daily after supper.     . ezetimibe (ZETIA) 10 MG tablet Take 10 mg by mouth at bedtime.     . fluocinonide cream (LIDEX) 3.79 % Apply 1 application topically 3 (three) times daily as needed (for itching).     . gabapentin  (NEURONTIN) 100 MG capsule Take 100 mg by mouth daily.    . Glucosamine-Chondroitin (OSTEO BI-FLEX REGULAR STRENGTH PO) Take 1 tablet by mouth daily after supper.    . insulin aspart (NOVOLOG) 100 UNIT/ML injection Inject into the skin daily. Via insulin pump    . Insulin Human (INSULIN PUMP) SOLN Inject into the skin. insulin aspart (NOVOLOG) 100 unit/ml    . losartan (COZAAR) 50 MG tablet Take 50 mg by mouth daily.     . metoprolol succinate (TOPROL-XL) 50 MG 24 hr tablet Take 25 mg by mouth 2 (two) times daily.     . mometasone (ELOCON) 0.1 % lotion Apply 4 drops topically 2 (two) times a week. Into ears    . Multiple Vitamin (MULTIVITAMIN WITH MINERALS) TABS tablet Take 1 tablet by mouth daily. Multivitamin for Women 50+    . NATURAL PSYLLIUM SEED PO Take 2 capsules by mouth daily at 3 pm.     . Omega-3 Fatty Acids (FISH OIL) 1200 MG CAPS Take 1,200 mg by mouth every evening.    Marland Kitchen  pantoprazole (PROTONIX) 40 MG tablet Take 40 mg by mouth daily before breakfast.     . prednisoLONE acetate (PRED FORTE) 1 % ophthalmic suspension Place 1 drop into the left eye at bedtime.    . Probiotic Product (PROBIOTIC DAILY PO) Take 1 capsule by mouth daily after supper.    Marland Kitchen azithromycin (ZITHROMAX Z-PAK) 250 MG tablet Dispense 1 Z-Pak as directed (Patient not taking: Reported on 10/03/2020) 6 each 0   No current facility-administered medications for this visit.     PHYSICAL EXAMINATION: ECOG PERFORMANCE STATUS: 1 - Symptomatic but completely ambulatory Vitals:   10/03/20 1448  BP: (!) 138/55  Pulse: 63  Resp: 18  Temp: 98.3 F (36.8 C)   Filed Weights   10/03/20 1448  Weight: 155 lb 8 oz (70.5 kg)    Physical Exam Constitutional:      General: She is not in acute distress. HENT:     Head: Normocephalic and atraumatic.  Eyes:     General: No scleral icterus. Cardiovascular:     Rate and Rhythm: Normal rate and regular rhythm.     Heart sounds: Normal heart sounds.  Pulmonary:      Effort: Pulmonary effort is normal. No respiratory distress.     Breath sounds: No wheezing.  Abdominal:     General: Bowel sounds are normal. There is no distension.     Palpations: Abdomen is soft.  Musculoskeletal:        General: No deformity. Normal range of motion.     Cervical back: Normal range of motion and neck supple.  Skin:    General: Skin is warm and dry.     Findings: No erythema or rash.  Neurological:     Mental Status: She is alert and oriented to person, place, and time. Mental status is at baseline.     Cranial Nerves: No cranial nerve deficit.     Coordination: Coordination normal.  Psychiatric:        Mood and Affect: Mood normal.     LABORATORY DATA:  I have reviewed the data as listed Lab Results  Component Value Date   WBC 21.5 (H) 10/03/2020   HGB 8.3 (L) 10/03/2020   HCT 25.6 (L) 10/03/2020   MCV 71.9 (L) 10/03/2020   PLT 350 10/03/2020   Recent Labs    10/03/20 1540  NA 126*  K 4.2  CL 93*  CO2 23  GLUCOSE 207*  BUN 22  CREATININE 0.89  CALCIUM 8.8*  GFRNONAA >60  PROT 6.9  ALBUMIN 3.8  AST 28  ALT 23  ALKPHOS 76  BILITOT 0.8   Iron/TIBC/Ferritin/ %Sat    Component Value Date/Time   IRON 22 (L) 10/03/2020 1540   TIBC 475 (H) 10/03/2020 1540   FERRITIN 13 10/03/2020 1540   IRONPCTSAT 5 (L) 10/03/2020 1540      RADIOGRAPHIC STUDIES: I have personally reviewed the radiological images as listed and agreed with the findings in the report. No results found.    ASSESSMENT & PLAN:  1. CLL (chronic lymphocytic leukemia) (HCC)   2. Other iron deficiency anemia   3. Hyponatremia   4. 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  Patient has been on observation/watchful waiting. Check hepatitis, HIV, LDH flow cytometry.  She does not have any constitutional symptoms.  #Microcytic anemia,  Check CBC, smear, iron, TIBC ferritin, reticulocyte panel, folate, B12, multiple  myeloma panel Iron panel came back consistent with  iron deficiency.  Discussed with patient about iron supplementation at next visit. #Hyponatremia, sodium is chronically decreased. Orders Placed This Encounter  Procedures  . CBC with Differential/Platelet    Standing Status:   Future    Number of Occurrences:   1    Standing Expiration Date:   10/03/2021  . Comprehensive metabolic panel    Standing Status:   Future    Number of Occurrences:   1    Standing Expiration Date:   10/03/2021  . Technologist smear review    Standing Status:   Future    Number of Occurrences:   1    Standing Expiration Date:   10/03/2021  . Ferritin    Standing Status:   Future    Number of Occurrences:   1    Standing Expiration Date:   04/02/2021  . Iron and TIBC    Standing Status:   Future    Number of Occurrences:   1    Standing Expiration Date:   10/03/2021  . Retic Panel    Standing Status:   Future    Number of Occurrences:   1    Standing Expiration Date:   10/03/2021  . Flow cytometry panel-leukemia/lymphoma work-up    Standing Status:   Future    Number of Occurrences:   1    Standing Expiration Date:   10/03/2021  . Hepatitis panel, acute    Standing Status:   Future    Number of Occurrences:   1    Standing Expiration Date:   10/03/2021  . HIV Antibody (routine testing w rflx)    Standing Status:   Future    Number of Occurrences:   1    Standing Expiration Date:   10/03/2021  . Lactate dehydrogenase    Standing Status:   Future    Number of Occurrences:   1    Standing Expiration Date:   10/03/2021  . Folate    Standing Status:   Future    Number of Occurrences:   1    Standing Expiration Date:   10/03/2021  . Vitamin B12    Standing Status:   Future    Number of Occurrences:   1    Standing Expiration Date:   10/03/2021  . Kappa/lambda light chains    Standing Status:   Future    Number of Occurrences:   1    Standing Expiration Date:   10/03/2021  . Multiple Myeloma Panel (SPEP&IFE w/QIG)     Standing Status:   Future    Number of Occurrences:   1    Standing Expiration Date:   10/03/2021    All questions were answered. The patient knows to call the clinic with any problems questions or concerns.  cc Lonia Farber, MD    Return of visit: 2 weeks to go over results. Thank you for this kind referral and the opportunity to participate in the care of this patient. A copy of today's note is routed to referring provider    Earlie Server, MD, PhD Hematology Oncology [facility] Pager- [phone] 10/03/2020

## 2020-10-04 LAB — KAPPA/LAMBDA LIGHT CHAINS
Kappa free light chain: 93.2 mg/L — ABNORMAL HIGH (ref 3.3–19.4)
Kappa, lambda light chain ratio: 10.47 — ABNORMAL HIGH (ref 0.26–1.65)
Lambda free light chains: 8.9 mg/L (ref 5.7–26.3)

## 2020-10-05 LAB — MULTIPLE MYELOMA PANEL, SERUM
Albumin SerPl Elph-Mcnc: 3.7 g/dL (ref 2.9–4.4)
Albumin/Glob SerPl: 1.5 (ref 0.7–1.7)
Alpha 1: 0.3 g/dL (ref 0.0–0.4)
Alpha2 Glob SerPl Elph-Mcnc: 1.1 g/dL — ABNORMAL HIGH (ref 0.4–1.0)
B-Globulin SerPl Elph-Mcnc: 0.9 g/dL (ref 0.7–1.3)
Gamma Glob SerPl Elph-Mcnc: 0.4 g/dL (ref 0.4–1.8)
Globulin, Total: 2.6 g/dL (ref 2.2–3.9)
IgA: 45 mg/dL — ABNORMAL LOW (ref 64–422)
IgG (Immunoglobin G), Serum: 360 mg/dL — ABNORMAL LOW (ref 586–1602)
IgM (Immunoglobulin M), Srm: 519 mg/dL — ABNORMAL HIGH (ref 26–217)
M Protein SerPl Elph-Mcnc: 0.1 g/dL — ABNORMAL HIGH
Total Protein ELP: 6.3 g/dL (ref 6.0–8.5)

## 2020-10-08 LAB — COMP PANEL: LEUKEMIA/LYMPHOMA: Immunophenotypic Profile: 59

## 2020-10-19 ENCOUNTER — Other Ambulatory Visit: Payer: Self-pay

## 2020-10-19 ENCOUNTER — Encounter: Payer: Self-pay | Admitting: Oncology

## 2020-10-19 ENCOUNTER — Ambulatory Visit: Payer: Medicare HMO

## 2020-10-19 ENCOUNTER — Inpatient Hospital Stay: Payer: Medicare HMO | Admitting: Oncology

## 2020-10-19 VITALS — BP 125/67 | HR 66 | Temp 96.6°F | Wt 156.0 lb

## 2020-10-19 DIAGNOSIS — Z7189 Other specified counseling: Secondary | ICD-10-CM

## 2020-10-19 DIAGNOSIS — D508 Other iron deficiency anemias: Secondary | ICD-10-CM | POA: Diagnosis not present

## 2020-10-19 DIAGNOSIS — D472 Monoclonal gammopathy: Secondary | ICD-10-CM

## 2020-10-19 DIAGNOSIS — C9110 Chronic lymphocytic leukemia of B-cell type not having achieved remission: Secondary | ICD-10-CM

## 2020-10-19 NOTE — Progress Notes (Signed)
Hematology/Oncology Consult note [facility] Telephone:([phone] [phone]   Patient Care Team: Baxter Hire, MD as PCP - General (Internal Medicine)  REFERRING PROVIDER: Baxter Hire, MD  CHIEF COMPLAINTS/REASON FOR VISIT:  Evaluation of CLL and anemia   HISTORY OF PRESENTING ILLNESS:   Cassandra Gentry is a  87 y.o.  female with PMH listed below was seen in consultation at the request of  Baxter Hire, MD  for evaluation of CLL and anemia  Reports a diagnosis of CLL and Cassandra Gentry previously follows up with [facility] oncology.  Cassandra Gentry participated in a clinical trial.  Patient lost to follow-up since the start of COVID-19 pandemic. Patient denies ever being on any treatment for CLL.  Denies any constitutional symptoms 05/04/2020, CBC showed hemoglobin 8.6, hematocrit 27, elevated white count 22.8, MCV 75.6, Patient was referred to hematology to establish care . Cassandra Gentry follows up with endocrinology for type 1 diabetes and was seen by Dr. Honor Junes on 09/12/2020.  Denies any change of bowel habit, bloody or black stool.  Cassandra Gentry reports intermittent rectal bleeding which Cassandra Gentry attributes to hemorrhoid bleeding.  # I obtained and reviewed oncology medical records at [facility]. Last seen by Dr. Dorothea Ogle on 12/01/2017. a. Incidentally noted to have a mild leukocytosis on a routine annual examination, 11/09.  b. Initial hematologic evaluation 07/22/08 shows WBC 13.5, HGB 12.5, PLT 214. Peripheral blood flow cytometry shows CD5, CD19, CD23, kappa restricted monoclonal B-cell population comprising 38% of peripheral blood events, CD38 negative, consistent with a diagnosis of chronic lymphocytic leukemia.  c. 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   INTERVAL HISTORY Cassandra Gentry  is a 87 y.o. female who has above history reviewed by me today presents for follow up visit for CLL, iron deficiency anemia, MGUS Problems and complaints are listed below: Patient has had work-up done since last visit.  Cassandra Gentry presents to discuss results and management plan. No new complaints.  Chronic fatigue. Review of Systems  Constitutional: Positive for fatigue. Negative for appetite change, chills and fever.  HENT:   Negative for hearing loss and voice change.   Eyes: Negative for eye problems.  Respiratory: Negative for chest tightness and cough.   Cardiovascular: Negative for chest pain.  Gastrointestinal: Negative for abdominal distention, abdominal pain and blood in stool.  Endocrine: Negative for hot flashes.  Genitourinary: Negative for difficulty urinating and frequency.   Musculoskeletal: Negative for arthralgias.  Skin: Negative for itching and rash.  Neurological: Negative for extremity weakness.  Hematological: Negative for adenopathy.  Psychiatric/Behavioral: Negative for confusion.    MEDICAL HISTORY:  Past Medical History:  Diagnosis Date  . Chronic lymphocytic leukemia ([place])   . Complication of anesthesia    SICK X 3 DAYS AFTER LAST CATARACT. LM FOR HER TO CALL BACK WITH MORE INFO  . Coronary artery disease   . Diabetes mellitus without complication ([place])   . Diverticulosis   . Dysrhythmia   . Edema    LEFT LEG  . GERD (gastroesophageal reflux disease)   . Hyperlipidemia   . Hypertension   . Thyroid disease     SURGICAL HISTORY: Past Surgical History:  Procedure Laterality Date  . CATARACT EXTRACTION W/PHACO Left 06/08/2018   Procedure: CATARACT EXTRACTION PHACO AND INTRAOCULAR LENS PLACEMENT (IOC);  Surgeon: Birder Robson, MD;  Location: [facility] ORS;  Service: Ophthalmology;  Laterality: Left;  [place] 02:20 CDE 23.41 Fluid pack lot # [id number]  . CATARACT EXTRACTION W/PHACO Right 07/13/2018   Procedure: CATARACT EXTRACTION PHACO AND INTRAOCULAR LENS  PLACEMENT ([place]);  Surgeon: Birder Robson, MD;  Location: [facility] ORS;  Service: Ophthalmology;  Laterality: Right;  [place] 01:18.2 CDE 15.42 Fluid Pack Lot # [id number]  . CORONARY ARTERY BYPASS GRAFT  2000    SOCIAL HISTORY: Social History   Socioeconomic History  . Marital status: Married    Spouse name: Not on file  . Number of children: Not on file  . Years of education: Not on file  . Highest education level: Not on file  Occupational History  . Not on file  Tobacco Use  . Smoking status: Never Smoker  . Smokeless tobacco: Never Used  Vaping Use  . Vaping Use: Never used  Substance and Sexual Activity  . Alcohol use: Never  . Drug use: Never  . Sexual activity: Not on file  Other Topics Concern  . Not on file  Social History Narrative  . Not on file   Social Determinants of Health   Financial Resource Strain: Not on file  Food Insecurity: Not on file  Transportation Needs: Not on file  Physical Activity: Not on file  Stress: Not on file  Social Connections: Not on file  Intimate Partner Violence: Not on file    FAMILY HISTORY: Family History  Problem Relation Age of Onset  . Parkinson's disease Mother   . Heart disease Father   . Stomach cancer Maternal Uncle     ALLERGIES:  is allergic to amoxicillin-pot clavulanate, other, shellfish allergy, and simvastatin.  MEDICATIONS:  Current Outpatient Medications  Medication Sig Dispense Refill  . acetaminophen (TYLENOL) 500 MG tablet Take 1,000 mg by mouth every 6 (six) hours as needed for moderate pain or headache.     . alendronate (FOSAMAX) 70 MG tablet Take by mouth. Take 1 tablet (70 mg total) by mouth every 7 (seven) days Take with a full glass of water. Do not lie down for the next 30 min.    . Ascorbic Acid (VITAMIN C) 1000 MG tablet Take 1,000 mg by mouth daily after supper.     Marland Kitchen aspirin 325 MG tablet Take 325 mg by mouth every evening.     . chlorthalidone (HYGROTON) 25 MG tablet Take 12.5 mg by mouth  every Monday, Wednesday, and Friday.     . cholecalciferol (VITAMIN D) 1000 units tablet Take 1,000 Units by mouth daily after supper.     . ezetimibe (ZETIA) 10 MG tablet Take 10 mg by mouth at bedtime.     . fluocinonide cream (LIDEX) 7.35 % Apply 1 application topically 3 (three) times daily as needed (for itching).     . gabapentin (NEURONTIN) 100 MG capsule Take 100 mg by mouth daily.    . Glucosamine-Chondroitin (OSTEO BI-FLEX REGULAR STRENGTH PO) Take 1 tablet by mouth daily after supper.    . insulin aspart (NOVOLOG) 100 UNIT/ML injection Inject into the skin daily. Via insulin pump    . Insulin Human (INSULIN PUMP) SOLN Inject into the skin. insulin aspart (NOVOLOG) 100 unit/ml    . losartan (COZAAR) 50 MG tablet Take 50 mg by mouth daily.     . metoprolol succinate (TOPROL-XL) 50 MG 24 hr tablet Take 25 mg by mouth 2 (two) times daily.     . mometasone (ELOCON) 0.1 % lotion Apply 4 drops topically 2 (two) times a week. Into  ears    . Multiple Vitamin (MULTIVITAMIN WITH MINERALS) TABS tablet Take 1 tablet by mouth daily. Multivitamin for Women 50+    . NATURAL PSYLLIUM SEED PO Take 2 capsules by mouth daily at 3 pm.     . Omega-3 Fatty Acids (FISH OIL) 1200 MG CAPS Take 1,200 mg by mouth every evening.    . pantoprazole (PROTONIX) 40 MG tablet Take 40 mg by mouth daily before breakfast.     . Probiotic Product (PROBIOTIC DAILY PO) Take 1 capsule by mouth daily after supper.     No current facility-administered medications for this visit.     PHYSICAL EXAMINATION: ECOG PERFORMANCE STATUS: 1 - Symptomatic but completely ambulatory Vitals:   10/19/20 0952  BP: 125/67  Pulse: 66  Temp: (!) 96.6 F (35.9 C)  SpO2: 100%   Filed Weights   10/19/20 0952  Weight: 156 lb (70.8 kg)    Physical Exam Constitutional:      General: Cassandra Gentry is not in acute distress. HENT:     Head: Normocephalic and atraumatic.  Eyes:     General: No scleral icterus. Cardiovascular:     Rate and  Rhythm: Normal rate and regular rhythm.     Heart sounds: Normal heart sounds.  Pulmonary:     Effort: Pulmonary effort is normal. No respiratory distress.     Breath sounds: No wheezing.  Abdominal:     General: Bowel sounds are normal. There is no distension.     Palpations: Abdomen is soft.  Musculoskeletal:        General: No deformity. Normal range of motion.     Cervical back: Normal range of motion and neck supple.  Skin:    General: Skin is warm and dry.     Findings: No erythema or rash.  Neurological:     Mental Status: Cassandra Gentry is alert and oriented to person, place, and time. Mental status is at baseline.     Cranial Nerves: No cranial nerve deficit.     Coordination: Coordination normal.  Psychiatric:        Mood and Affect: Mood normal.     LABORATORY DATA:  I have reviewed the data as listed Lab Results  Component Value Date   WBC 21.5 (H) 10/03/2020   HGB 8.3 (L) 10/03/2020   HCT 25.6 (L) 10/03/2020   MCV 71.9 (L) 10/03/2020   PLT 350 10/03/2020   Recent Labs    10/03/20 1540  NA 126*  K 4.2  CL 93*  CO2 23  GLUCOSE 207*  BUN 22  CREATININE 0.89  CALCIUM 8.8*  GFRNONAA >60  PROT 6.9  ALBUMIN 3.8  AST 28  ALT 23  ALKPHOS 76  BILITOT 0.8   Iron/TIBC/Ferritin/ %Sat    Component Value Date/Time   IRON 22 (L) 10/03/2020 1540   TIBC 475 (H) 10/03/2020 1540   FERRITIN 13 10/03/2020 1540   IRONPCTSAT 5 (L) 10/03/2020 1540      RADIOGRAPHIC STUDIES: I have personally reviewed the radiological images as listed and agreed with the findings in the report. No results found.    ASSESSMENT & PLAN:  1. CLL (chronic lymphocytic leukemia) (HCC)   2. Other iron deficiency anemia   3. MGUS (monoclonal gammopathy of unknown significance)   4. 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  Continue watchful waiting. Will check CLL FISH, I GVH at the next visit.  #Iron deficiency anemia,  discussed about option  of oral iron supplementation versus IV iron infusion.  Patient is interesting in proceeding with IV iron infusion for Plan IV iron with Venofer 225m weekly x 4 doses. Allergy reactions/infusion reaction including anaphylactic reaction discussed with patient. Other side effects include but not limited to high blood pressure, skin rash, weight gain, leg swelling, high blood pressure etc. Patient voices understanding and willing to proceed.  #Hyponatremia, sodium is chronically decreased.  #MGUS, Multiple myeloma panel showed IgM monoclonal protein with kappa light chain specificity.  M protein 0.1, Light chain ratio is elevated at 10.  Check urine protein electrophoresis and immunofixation. I discussed with patient about the diagnosis of IgM MGUS which is an asymptomatic condition which has a small risk of progression to smoldering [place] macroglobulinemia and to symptomatic [place] macroglobulinemia, and less often to lymphoma or AL amyloidosis. Infrequently, IgM MGUS can progress to IgM multiple myeloma. I recommend observation. Check multiple myeloma panel, light chain ratio every 6 months, NT proBNP annually.   Orders Placed This Encounter  Procedures  . IFE and PE, Random Urine    Standing Status:   Future    Number of Occurrences:   1    Standing Expiration Date:   10/19/2021  . CBC with Differential/Platelet    Standing Status:   Future    Standing Expiration Date:   10/19/2021  . Ferritin    Standing Status:   Future    Standing Expiration Date:   10/19/2021  . Iron and TIBC    Standing Status:   Future    Standing Expiration Date:   10/19/2021    All questions were answered. The patient knows to call the clinic with any problems questions or concerns.  cc JBaxter Hire MD    Return of visit: 8 weeks to go over results. Thank you for this kind referral and the opportunity to participate in the care of this patient. A copy of today's note is  routed to referring provider    ZEarlie Server MD, PhD Hematology Oncology [facility] Centerat AMorrill County Community HospitalPager- [phone]/18/2022

## 2020-10-19 NOTE — Progress Notes (Signed)
Patient here for follow up. No new concerns voiced.  °

## 2020-10-23 LAB — IFE AND PE, RANDOM URINE
% BETA, Urine: 54.2 %
ALPHA 1 URINE: 7.4 %
Albumin, U: 18.3 %
Alpha 2, Urine: 7.7 %
GAMMA GLOBULIN URINE: 12.4 %
M-SPIKE %, Urine: 35.9 % — ABNORMAL HIGH
Total Protein, Urine: 74.1 mg/dL

## 2020-10-25 ENCOUNTER — Other Ambulatory Visit: Payer: Self-pay | Admitting: Oncology

## 2020-10-25 DIAGNOSIS — D509 Iron deficiency anemia, unspecified: Secondary | ICD-10-CM | POA: Insufficient documentation

## 2020-10-26 ENCOUNTER — Inpatient Hospital Stay: Payer: Medicare HMO

## 2020-10-26 VITALS — BP 132/74 | HR 66 | Temp 96.9°F | Resp 18

## 2020-10-26 DIAGNOSIS — C9110 Chronic lymphocytic leukemia of B-cell type not having achieved remission: Secondary | ICD-10-CM | POA: Diagnosis not present

## 2020-10-26 DIAGNOSIS — D509 Iron deficiency anemia, unspecified: Secondary | ICD-10-CM

## 2020-10-26 MED ORDER — SODIUM CHLORIDE 0.9 % IV SOLN
Freq: Once | INTRAVENOUS | Status: AC
  Filled 2020-10-26: qty 250

## 2020-10-26 MED ORDER — IRON SUCROSE 20 MG/ML IV SOLN
200.0000 mg | Freq: Once | INTRAVENOUS | Status: AC
  Administered 2020-10-26: 200 mg via INTRAVENOUS
  Filled 2020-10-26: qty 10

## 2020-10-26 MED ORDER — SODIUM CHLORIDE 0.9 % IV SOLN: 200.0000 mg | Freq: Once | INTRAVENOUS | Status: DC

## 2020-10-26 NOTE — Progress Notes (Signed)
Patient received prescribed treatment in clinic. IV Venofer. Tolerated well. Patient stable at discharge. 

## 2020-10-30 IMAGING — MR MR BRAIN/IAC WO/W CM
9 of 12 series · 26 of 48 positions shown · IV contrast (gadavist)
Comparison: None.

CLINICAL DATA: Fall with head trauma. Dizziness for 1-2 weeks

EXAM:
MRI HEAD WITHOUT AND WITH CONTRAST
TECHNIQUE: Multiplanar, multiecho pulse sequences of the brain and surrounding
structures were obtained without and with intravenous contrast.
CONTRAST:  7mL GADAVIST GADOBUTROL 1 MMOL/ML IV SOLN

[Series 6: ax dwi_tracew · axial · 3.0mm · 0.60mm/px · z∈[-54,+95]mm · 4 of 46 slices shown]
[im 1/46]
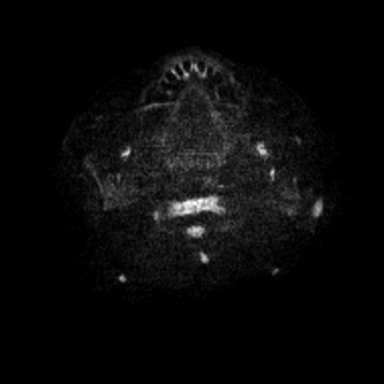
[im 16/46]
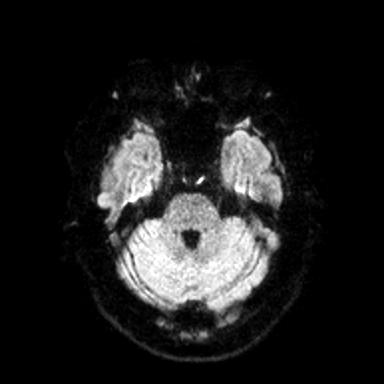
[im 31/46]
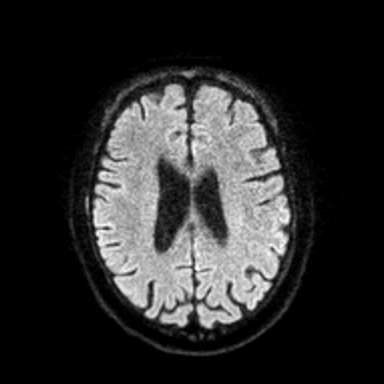
[im 46/46]
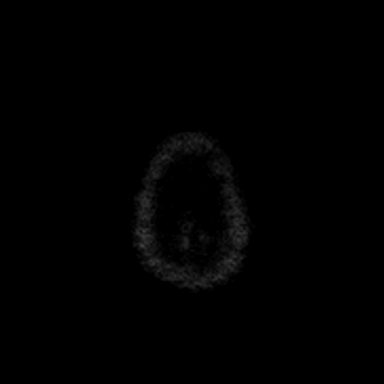

[Series 7: ax dwi_adc · axial · 3.0mm · 0.60mm/px · z∈[-54,+45]mm · 3 of 46 slices shown]
[im 1/46]
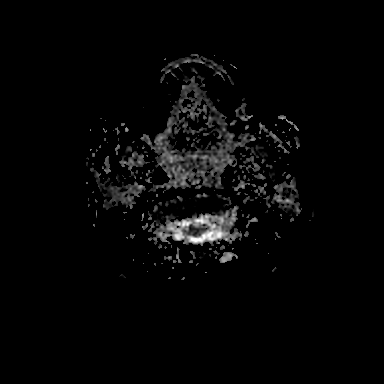
[im 16/46]
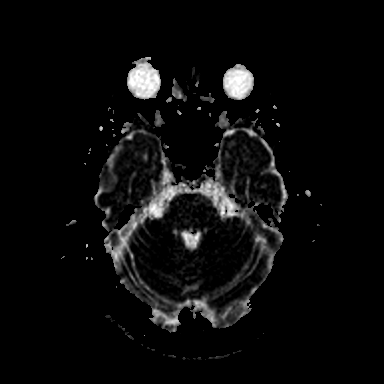
[im 31/46]
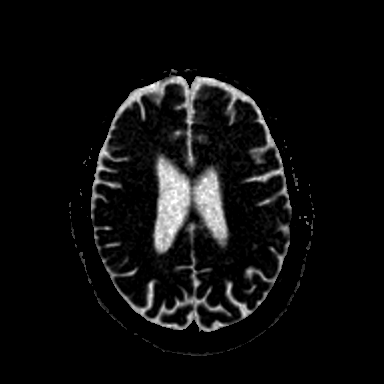

[Series 8: T2 · axial · 5.0mm · 0.53mm/px · z∈[-44,+94]mm · 2 of 24 slices shown]
[im 1/24]
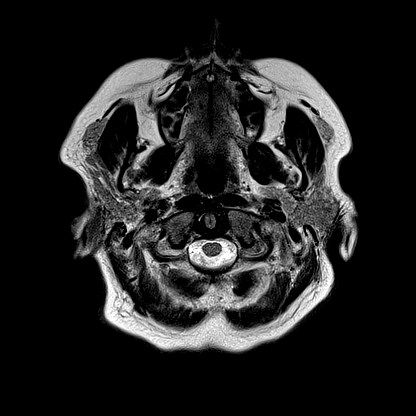
[im 24/24]
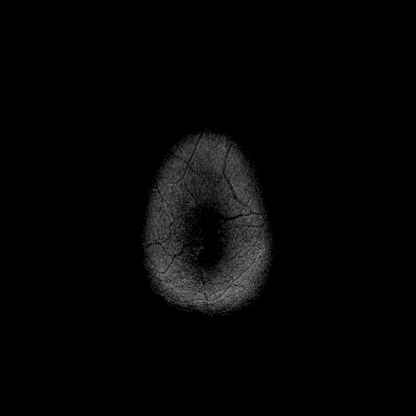

[Series 13: FLAIR · axial · 3.0mm · 0.53mm/px · z∈[-56,+106]mm · 5 of 55 slices shown]
[im 1/55]
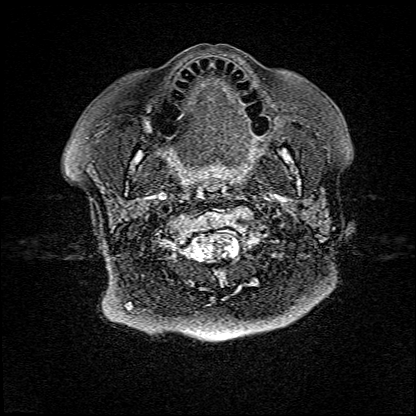
[im 14/55]
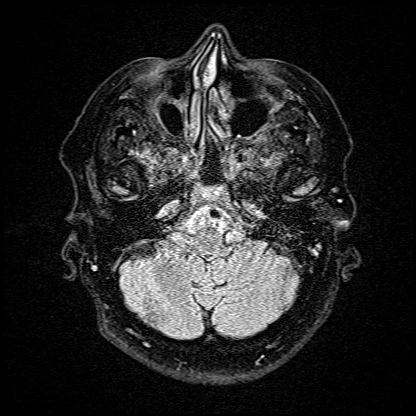
[im 28/55]
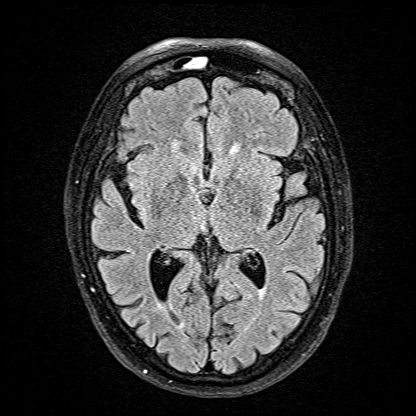
[im 41/55]
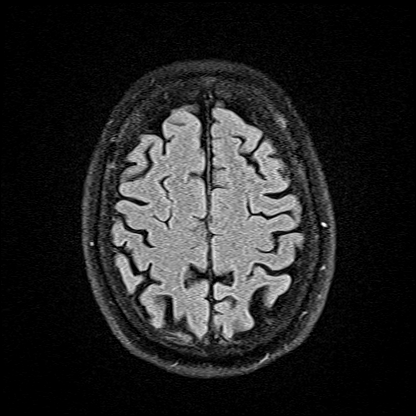
[im 55/55]
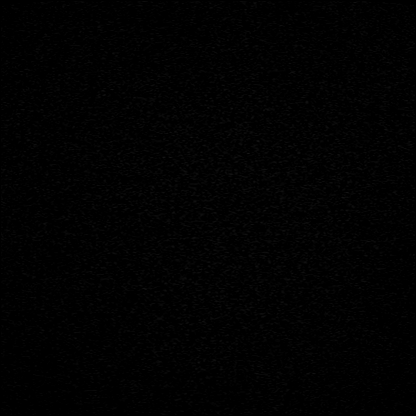

[Series 14: T1 · coronal · non-contrast · 3.0mm · 0.21mm/px · 1 of 13 slices shown (1 of 2)]
[im 1/13]
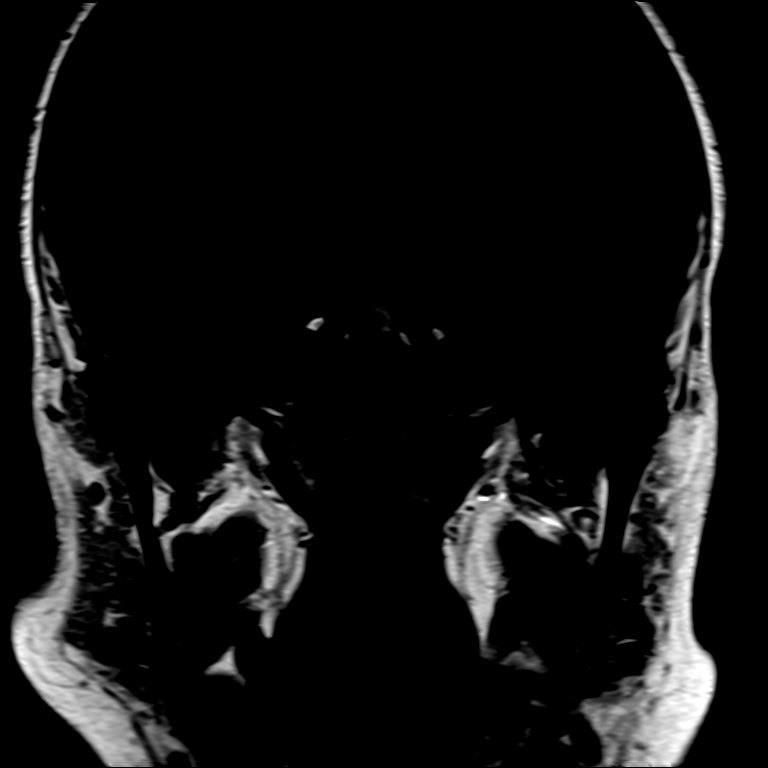

[Series 16: T1 · axial · non-contrast · 3.0mm · 0.21mm/px · 1 of 15 slices shown (2 of 2)]
[im 1/15]
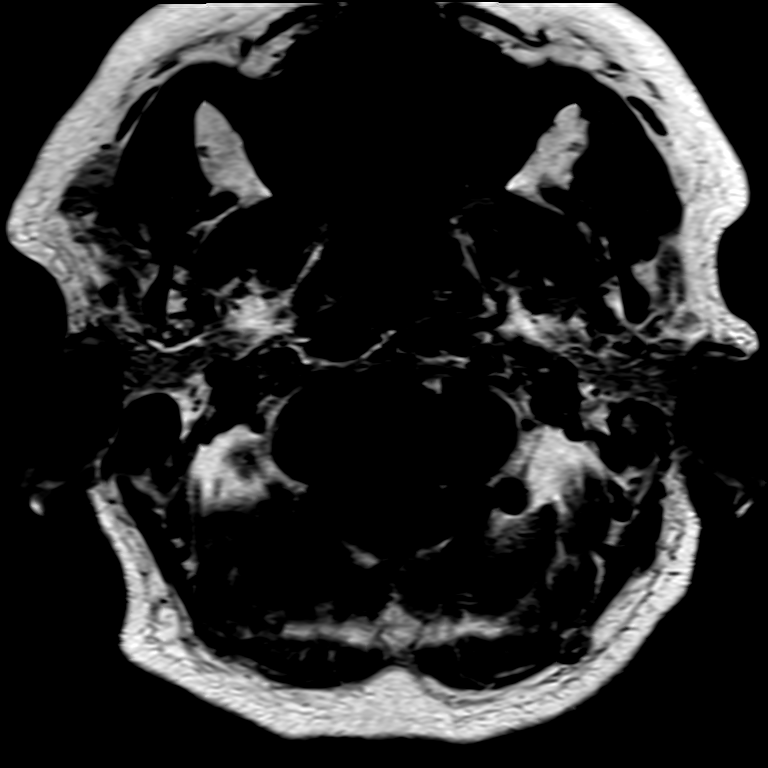

[Series 17: T1 post-contrast · axial · 3.0mm · 0.21mm/px · 1 of 15 slices shown (1 of 3)]
[im 1/15]
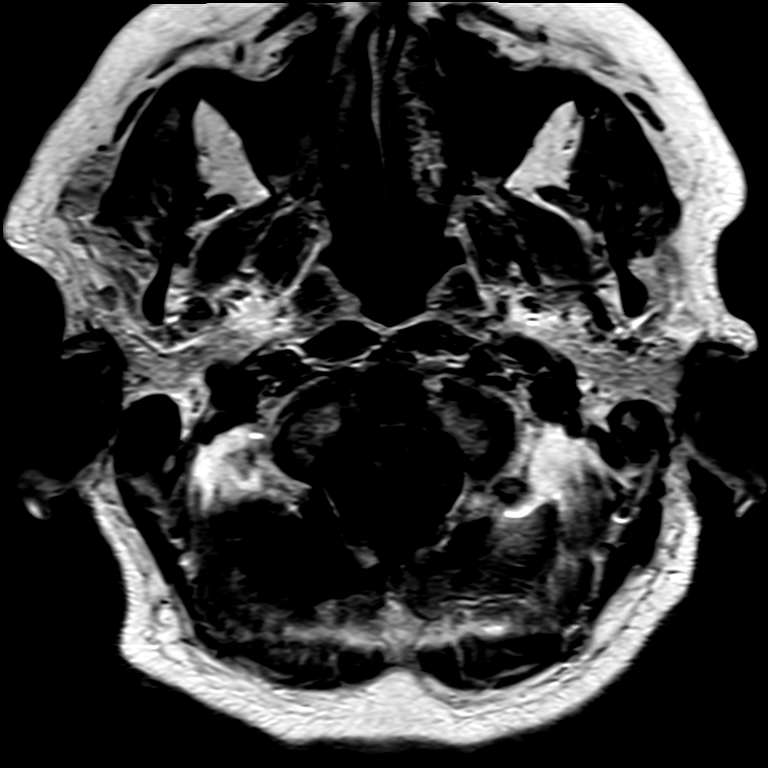

[Series 18: T1 post-contrast · coronal · 3.0mm · 0.21mm/px · 1 of 13 slices shown (2 of 3)]
[im 1/13]
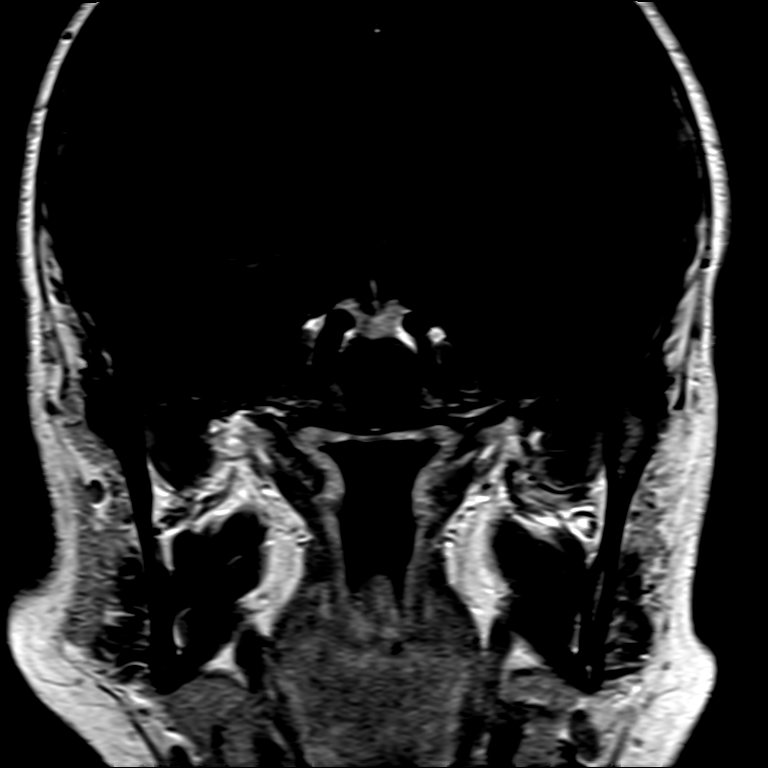

[Series 19: T1 post-contrast · axial · 1.0mm · 0.98mm/px · z∈[-54,+105]mm · 8 of 160 slices shown (3 of 3)]
[im 1/160]
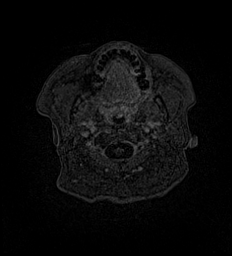
[im 25/160]
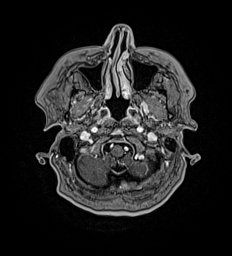
[im 49/160]
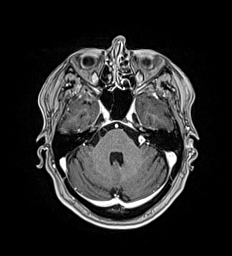
[im 74/160]
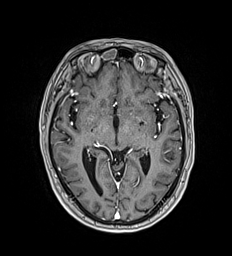
[im 86/160]
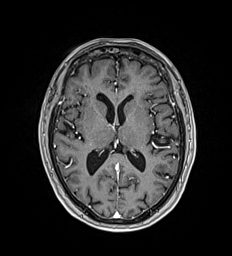
[im 111/160]
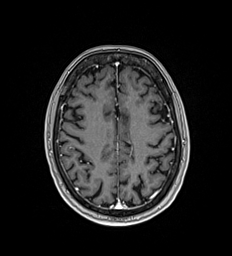
[im 135/160]
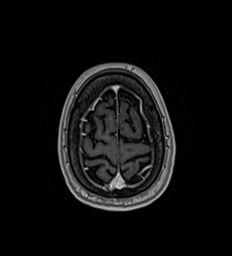
[im 160/160]
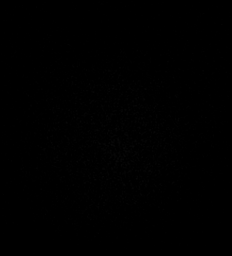

[26 of 48 positions shown; findings below may reference images not displayed]

FINDINGS: BRAIN: There is no acute infarct, acute hemorrhage or extra-axial
collection. The white matter signal is normal for the patient's age.
The cerebral and cerebellar volume are age-appropriate. There is no
hydrocephalus. The midline structures are normal.

INTERNAL AUDITORY CANALS: There is a left cerebellopontine angle
mass with extension into the internal auditory canal that measures
1.3 x 0.8 x 0.8 cm. Right internal auditory canals normal. Bilateral
semicircular canals and cochlea are normal.

VASCULAR: The major intracranial arterial and venous sinus flow
voids are normal. Susceptibility-sensitive sequences show no chronic
microhemorrhage or superficial siderosis.

SKULL AND UPPER CERVICAL SPINE: Calvarial bone marrow signal is
normal. There is no skull base mass. The visualized upper cervical
spine and soft tissues are normal.

SINUSES/ORBITS: There are no fluid levels or advanced mucosal
thickening. The mastoid air cells and middle ear cavities are free
of fluid. The orbits are normal.
IMPRESSION: 1. Left cerebellopontine angle mass consistent with vestibular
schwannoma.
2. Normal MRI of the brain for age.

## 2020-11-02 ENCOUNTER — Inpatient Hospital Stay: Payer: Medicare HMO | Attending: Oncology

## 2020-11-02 VITALS — BP 155/66 | HR 56 | Temp 97.0°F

## 2020-11-02 DIAGNOSIS — Z79899 Other long term (current) drug therapy: Secondary | ICD-10-CM | POA: Diagnosis not present

## 2020-11-02 DIAGNOSIS — D509 Iron deficiency anemia, unspecified: Secondary | ICD-10-CM | POA: Diagnosis present

## 2020-11-02 MED ORDER — IRON SUCROSE 20 MG/ML IV SOLN
200.0000 mg | Freq: Once | INTRAVENOUS | Status: AC
  Administered 2020-11-02: 200 mg via INTRAVENOUS
  Filled 2020-11-02: qty 10

## 2020-11-02 MED ORDER — SODIUM CHLORIDE 0.9 % IV SOLN
Freq: Once | INTRAVENOUS | Status: AC
  Filled 2020-11-02: qty 250

## 2020-11-02 MED ORDER — SODIUM CHLORIDE 0.9 % IV SOLN
200.0000 mg | Freq: Once | INTRAVENOUS | Status: DC
  Filled 2020-11-02: qty 10

## 2020-11-02 NOTE — Progress Notes (Signed)
Pt received prescribed treatment in clinic, pt stable at d/c. 

## 2020-11-09 ENCOUNTER — Other Ambulatory Visit: Payer: Self-pay

## 2020-11-09 ENCOUNTER — Inpatient Hospital Stay: Payer: Medicare HMO

## 2020-11-09 VITALS — BP 137/52 | HR 51 | Temp 97.6°F | Resp 18

## 2020-11-09 DIAGNOSIS — D509 Iron deficiency anemia, unspecified: Secondary | ICD-10-CM

## 2020-11-09 MED ORDER — SODIUM CHLORIDE 0.9 % IV SOLN: 200.0000 mg | Freq: Once | INTRAVENOUS | Status: DC

## 2020-11-09 MED ORDER — SODIUM CHLORIDE 0.9 % IV SOLN
Freq: Once | INTRAVENOUS | Status: AC
  Filled 2020-11-09: qty 250

## 2020-11-09 MED ORDER — IRON SUCROSE 20 MG/ML IV SOLN
200.0000 mg | Freq: Once | INTRAVENOUS | Status: AC
  Administered 2020-11-09: 200 mg via INTRAVENOUS
  Filled 2020-11-09: qty 10

## 2020-11-16 ENCOUNTER — Inpatient Hospital Stay: Payer: Medicare HMO

## 2020-11-16 ENCOUNTER — Other Ambulatory Visit: Payer: Self-pay

## 2020-11-16 VITALS — BP 140/56 | HR 56 | Temp 97.2°F | Resp 20

## 2020-11-16 DIAGNOSIS — D509 Iron deficiency anemia, unspecified: Secondary | ICD-10-CM

## 2020-11-16 MED ORDER — SODIUM CHLORIDE 0.9 % IV SOLN
Freq: Once | INTRAVENOUS | Status: AC
  Filled 2020-11-16: qty 250

## 2020-11-16 MED ORDER — IRON SUCROSE 20 MG/ML IV SOLN
200.0000 mg | Freq: Once | INTRAVENOUS | Status: AC
  Administered 2020-11-16: 200 mg via INTRAVENOUS
  Filled 2020-11-16: qty 10

## 2020-11-16 MED ORDER — SODIUM CHLORIDE 0.9 % IV SOLN: 200.0000 mg | Freq: Once | INTRAVENOUS | Status: DC

## 2020-12-12 ENCOUNTER — Inpatient Hospital Stay: Payer: Medicare HMO | Attending: Oncology

## 2020-12-12 ENCOUNTER — Other Ambulatory Visit: Payer: Self-pay

## 2020-12-12 DIAGNOSIS — E109 Type 1 diabetes mellitus without complications: Secondary | ICD-10-CM | POA: Diagnosis not present

## 2020-12-12 DIAGNOSIS — I10 Essential (primary) hypertension: Secondary | ICD-10-CM | POA: Insufficient documentation

## 2020-12-12 DIAGNOSIS — E785 Hyperlipidemia, unspecified: Secondary | ICD-10-CM | POA: Insufficient documentation

## 2020-12-12 DIAGNOSIS — D509 Iron deficiency anemia, unspecified: Secondary | ICD-10-CM | POA: Diagnosis not present

## 2020-12-12 DIAGNOSIS — C9000 Multiple myeloma not having achieved remission: Secondary | ICD-10-CM | POA: Diagnosis not present

## 2020-12-12 DIAGNOSIS — D72819 Decreased white blood cell count, unspecified: Secondary | ICD-10-CM | POA: Diagnosis not present

## 2020-12-12 DIAGNOSIS — D472 Monoclonal gammopathy: Secondary | ICD-10-CM

## 2020-12-12 DIAGNOSIS — K219 Gastro-esophageal reflux disease without esophagitis: Secondary | ICD-10-CM | POA: Diagnosis not present

## 2020-12-12 DIAGNOSIS — I2510 Atherosclerotic heart disease of native coronary artery without angina pectoris: Secondary | ICD-10-CM | POA: Diagnosis not present

## 2020-12-12 DIAGNOSIS — C9110 Chronic lymphocytic leukemia of B-cell type not having achieved remission: Secondary | ICD-10-CM | POA: Diagnosis present

## 2020-12-12 DIAGNOSIS — E119 Type 2 diabetes mellitus without complications: Secondary | ICD-10-CM | POA: Insufficient documentation

## 2020-12-12 DIAGNOSIS — Z794 Long term (current) use of insulin: Secondary | ICD-10-CM | POA: Insufficient documentation

## 2020-12-12 LAB — CBC WITH DIFFERENTIAL/PLATELET
Abs Immature Granulocytes: 0.06 10*3/uL (ref 0.00–0.07)
Basophils Absolute: 0.1 10*3/uL (ref 0.0–0.1)
Basophils Relative: 0 %
Eosinophils Absolute: 0.3 10*3/uL (ref 0.0–0.5)
Eosinophils Relative: 2 %
HCT: 29.9 % — ABNORMAL LOW (ref 36.0–46.0)
Hemoglobin: 9.9 g/dL — ABNORMAL LOW (ref 12.0–15.0)
Immature Granulocytes: 0 %
Lymphocytes Relative: 73 %
Lymphs Abs: 11.5 10*3/uL — ABNORMAL HIGH (ref 0.7–4.0)
MCH: 26.7 pg (ref 26.0–34.0)
MCHC: 33.1 g/dL (ref 30.0–36.0)
MCV: 80.6 fL (ref 80.0–100.0)
Monocytes Absolute: 0.7 10*3/uL (ref 0.1–1.0)
Monocytes Relative: 5 %
Neutro Abs: 3.2 10*3/uL (ref 1.7–7.7)
Neutrophils Relative %: 20 %
Platelets: 203 10*3/uL (ref 150–400)
RBC: 3.71 MIL/uL — ABNORMAL LOW (ref 3.87–5.11)
RDW: 22.7 % — ABNORMAL HIGH (ref 11.5–15.5)
WBC: 15.9 10*3/uL — ABNORMAL HIGH (ref 4.0–10.5)
nRBC: 0 % (ref 0.0–0.2)

## 2020-12-12 LAB — IRON AND TIBC
Iron: 57 ug/dL (ref 28–170)
Saturation Ratios: 15 % (ref 10.4–31.8)
TIBC: 372 ug/dL (ref 250–450)
UIBC: 315 ug/dL

## 2020-12-12 LAB — FERRITIN: Ferritin: 46 ng/mL (ref 11–307)

## 2020-12-13 LAB — MISC LABCORP TEST (SEND OUT): Labcorp test code: 143000

## 2020-12-14 ENCOUNTER — Inpatient Hospital Stay: Payer: Medicare HMO

## 2020-12-14 ENCOUNTER — Inpatient Hospital Stay (HOSPITAL_BASED_OUTPATIENT_CLINIC_OR_DEPARTMENT_OTHER): Payer: Medicare HMO | Admitting: Oncology

## 2020-12-14 ENCOUNTER — Other Ambulatory Visit: Payer: Self-pay

## 2020-12-14 ENCOUNTER — Encounter: Payer: Self-pay | Admitting: Oncology

## 2020-12-14 DIAGNOSIS — D472 Monoclonal gammopathy: Secondary | ICD-10-CM

## 2020-12-14 DIAGNOSIS — D509 Iron deficiency anemia, unspecified: Secondary | ICD-10-CM

## 2020-12-14 DIAGNOSIS — C9110 Chronic lymphocytic leukemia of B-cell type not having achieved remission: Secondary | ICD-10-CM | POA: Diagnosis not present

## 2020-12-14 NOTE — Progress Notes (Signed)
Hematology/Oncology Consult note [facility] Telephone:([phone] [phone]   Patient Care Team: Baxter Hire, MD as PCP - General (Internal Medicine)  REFERRING PROVIDER: Baxter Hire, MD  CHIEF COMPLAINTS/REASON FOR VISIT:  Evaluation of CLL and anemia   HISTORY OF PRESENTING ILLNESS:   Cassandra Gentry is a  87 y.o.  female with PMH listed below was seen in consultation at the request of  Baxter Hire, MD  for evaluation of CLL and anemia  Reports a diagnosis of CLL and she previously follows up with [facility] oncology.  She participated in a clinical trial.  Patient lost to follow-up since the start of COVID-19 pandemic. Patient denies ever being on any treatment for CLL.  Denies any constitutional symptoms 05/04/2020, CBC showed hemoglobin 8.6, hematocrit 27, elevated white count 22.8, MCV 75.6, Patient was referred to hematology to establish care . She follows up with endocrinology for type 1 diabetes and was seen by Dr. Honor Junes on 09/12/2020.  Denies any change of bowel habit, bloody or black stool.  She reports intermittent rectal bleeding which she attributes to hemorrhoid bleeding.  # I obtained and reviewed oncology medical records at [facility]. Last seen by Dr. Dorothea Ogle on 12/01/2017. a. Incidentally noted to have a mild leukocytosis on a routine annual examination, 11/09.  b. Initial hematologic evaluation 07/22/08 shows WBC 13.5, HGB 12.5, PLT 214. Peripheral blood flow cytometry shows CD5, CD19, CD23, kappa restricted monoclonal B-cell population comprising 38% of peripheral blood events, CD38 negative, consistent with a diagnosis of chronic lymphocytic leukemia.  c. Initial evaluation at [facility] 09/27/08 shows a WBC 13.8, peripheral blood interphase cytogenetics reveals deletion 13q14.3 in 81/200 cells and an immunoglobulin heavy chain translocation with an unknown translocation partner in 165/200 cells.   INTERVAL HISTORY Cassandra Gentry  is a 87 y.o. female who has above history reviewed by me today presents for follow up visit for CLL, iron deficiency anemia, MGUS Problems and complaints are listed below Patient reports feeling well.  She has improved energy level since previous IV Venofer treatments. She was accompanied by her husband who is also my patient. Denies any black or bloody stool.  Remote colonoscopy history.  :Review of Systems  Constitutional: Positive for fatigue. Negative for appetite change, chills and fever.  HENT:   Negative for hearing loss and voice change.   Eyes: Negative for eye problems.  Respiratory: Negative for chest tightness and cough.   Cardiovascular: Negative for chest pain.  Gastrointestinal: Negative for abdominal distention, abdominal pain and blood in stool.  Endocrine: Negative for hot flashes.  Genitourinary: Negative for difficulty urinating and frequency.   Musculoskeletal: Negative for arthralgias.  Skin: Negative for itching and rash.  Neurological: Negative for extremity weakness.  Hematological: Negative for adenopathy.  Psychiatric/Behavioral: Negative for confusion.    MEDICAL HISTORY:  Past Medical History:  Diagnosis Date  . Chronic lymphocytic leukemia ([place])   . Complication of anesthesia    SICK X 3 DAYS AFTER LAST CATARACT. LM FOR HER TO CALL BACK WITH MORE INFO  . Coronary artery disease   . Diabetes mellitus without complication ([place])   . Diverticulosis   . Dysrhythmia   . Edema    LEFT LEG  . GERD (gastroesophageal reflux disease)   . Hyperlipidemia   . Hypertension   . Thyroid disease     SURGICAL HISTORY: Past Surgical History:  Procedure Laterality Date  . CATARACT EXTRACTION W/PHACO Left 06/08/2018   Procedure: CATARACT EXTRACTION PHACO AND  INTRAOCULAR LENS PLACEMENT (IOC);  Surgeon: Birder Robson, MD;  Location: [facility] ORS;  Service: Ophthalmology;  Laterality: Left;  [place] 02:20 CDE 23.41 Fluid pack lot # [id number]  . CATARACT EXTRACTION W/PHACO  Right 07/13/2018   Procedure: CATARACT EXTRACTION PHACO AND INTRAOCULAR LENS PLACEMENT ([place]);  Surgeon: Birder Robson, MD;  Location: [facility] ORS;  Service: Ophthalmology;  Laterality: Right;  [place] 01:18.2 CDE 15.42 Fluid Pack Lot # [id number]  . CORONARY ARTERY BYPASS GRAFT  2000    SOCIAL HISTORY: Social History   Socioeconomic History  . Marital status: Married    Spouse name: Not on file  . Number of children: Not on file  . Years of education: Not on file  . Highest education level: Not on file  Occupational History  . Not on file  Tobacco Use  . Smoking status: Never Smoker  . Smokeless tobacco: Never Used  Vaping Use  . Vaping Use: Never used  Substance and Sexual Activity  . Alcohol use: Never  . Drug use: Never  . Sexual activity: Not on file  Other Topics Concern  . Not on file  Social History Narrative  . Not on file   Social Determinants of Health   Financial Resource Strain: Not on file  Food Insecurity: Not on file  Transportation Needs: Not on file  Physical Activity: Not on file  Stress: Not on file  Social Connections: Not on file  Intimate Partner Violence: Not on file    FAMILY HISTORY: Family History  Problem Relation Age of Onset  . Parkinson's disease Mother   . Heart disease Father   . Stomach cancer Maternal Uncle     ALLERGIES:  is allergic to amoxicillin-pot clavulanate, other, shellfish allergy, and simvastatin.  MEDICATIONS:  Current Outpatient Medications  Medication Sig Dispense Refill  . acetaminophen (TYLENOL) 500 MG tablet Take 1,000 mg by mouth every 6 (six) hours as needed for moderate pain or headache.     . alendronate (FOSAMAX) 70 MG tablet Take by mouth. Take 1 tablet (70 mg total) by mouth every 7 (seven) days Take with a full glass of water. Do not lie down for the next 30 min.    . Ascorbic Acid (VITAMIN C) 1000 MG tablet Take 1,000 mg by mouth daily after supper.     Marland Kitchen aspirin 325 MG tablet Take 325 mg by mouth every  evening.     . chlorthalidone (HYGROTON) 25 MG tablet Take 12.5 mg by mouth every Monday, Wednesday, and Friday.     . cholecalciferol (VITAMIN D) 1000 units tablet Take 1,000 Units by mouth daily after supper.     . ezetimibe (ZETIA) 10 MG tablet Take 10 mg by mouth at bedtime.     . fluocinonide cream (LIDEX) 0.10 % Apply 1 application topically 3 (three) times daily as needed (for itching).     . gabapentin (NEURONTIN) 100 MG capsule Take 100 mg by mouth daily.    . Glucosamine-Chondroitin (OSTEO BI-FLEX REGULAR STRENGTH PO) Take 1 tablet by mouth daily after supper.    . insulin aspart (NOVOLOG) 100 UNIT/ML injection Inject into the skin daily. Via insulin pump    . Insulin Human (INSULIN PUMP) SOLN Inject into the skin. insulin aspart (NOVOLOG) 100 unit/ml    . losartan (COZAAR) 50 MG tablet Take 50 mg by mouth daily.     . metoprolol succinate (TOPROL-XL) 50 MG 24 hr tablet Take 25 mg by mouth 2 (two) times daily.     . mometasone (  ELOCON) 0.1 % lotion Apply 4 drops topically 2 (two) times a week. Into ears    . Multiple Vitamin (MULTIVITAMIN WITH MINERALS) TABS tablet Take 1 tablet by mouth daily. Multivitamin for Women 50+    . NATURAL PSYLLIUM SEED PO Take 2 capsules by mouth daily at 3 pm.     . Omega-3 Fatty Acids (FISH OIL) 1200 MG CAPS Take 1,200 mg by mouth every evening.    . pantoprazole (PROTONIX) 40 MG tablet Take 40 mg by mouth daily before breakfast.     . Probiotic Product (PROBIOTIC DAILY PO) Take 1 capsule by mouth daily after supper.     No current facility-administered medications for this visit.     PHYSICAL EXAMINATION: ECOG PERFORMANCE STATUS: 1 - Symptomatic but completely ambulatory There were no vitals filed for this visit. There were no vitals filed for this visit.  Physical Exam Constitutional:      General: She is not in acute distress. HENT:     Head: Normocephalic and atraumatic.  Eyes:     General: No scleral icterus. Cardiovascular:     Rate  and Rhythm: Normal rate and regular rhythm.     Heart sounds: Normal heart sounds.  Pulmonary:     Effort: Pulmonary effort is normal. No respiratory distress.     Breath sounds: No wheezing.  Abdominal:     General: Bowel sounds are normal. There is no distension.     Palpations: Abdomen is soft.  Musculoskeletal:        General: No deformity. Normal range of motion.     Cervical back: Normal range of motion and neck supple.  Skin:    General: Skin is warm and dry.     Findings: No erythema or rash.  Neurological:     Mental Status: She is alert and oriented to person, place, and time. Mental status is at baseline.     Cranial Nerves: No cranial nerve deficit.     Coordination: Coordination normal.  Psychiatric:        Mood and Affect: Mood normal.     LABORATORY DATA:  I have reviewed the data as listed Lab Results  Component Value Date   WBC 15.9 (H) 12/12/2020   HGB 9.9 (L) 12/12/2020   HCT 29.9 (L) 12/12/2020   MCV 80.6 12/12/2020   PLT 203 12/12/2020   Recent Labs    10/03/20 1540  NA 126*  K 4.2  CL 93*  CO2 23  GLUCOSE 207*  BUN 22  CREATININE 0.89  CALCIUM 8.8*  GFRNONAA >60  PROT 6.9  ALBUMIN 3.8  AST 28  ALT 23  ALKPHOS 76  BILITOT 0.8   Iron/TIBC/Ferritin/ %Sat    Component Value Date/Time   IRON 57 12/12/2020 1139   TIBC 372 12/12/2020 1139   FERRITIN 46 12/12/2020 1139   IRONPCTSAT 15 12/12/2020 1139      RADIOGRAPHIC STUDIES: I have personally reviewed the radiological images as listed and agreed with the findings in the report. No results found.    ASSESSMENT & PLAN:  1. Iron deficiency anemia, unspecified iron deficiency anemia type   2. MGUS (monoclonal gammopathy of unknown significance)   3. 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  No constitutional symptoms.  Continue watchful waiting. Check CLL FISH, IGVH at the next visit.  #Iron deficiency anemia,   Iron level has improved.  Proceed with IV Venofer 200x1 to further consolidate and  improve iron stores. Hemoglobin has improved to 9.9-not normalized.  #IgM MGUS, Multiple myeloma panel showed IgM monoclonal protein with kappa light chain specificity.  M protein 0.1, Light chain ratio is elevated at 10.  Urine protein electrophoresis and immunofixation showed Benders protein kappa type. Check NT proBNP annually.  Repeat panel at next visit.  Consider bone marrow biopsy if hemoglobin persistently low despite resolve of iron deficiency.  Orders Placed This Encounter  Procedures  . Ambulatory referral to Gastroenterology    Referral Priority:   Routine    Referral Type:   Consultation    Referral Reason:   Specialty Services Required    Referred to Provider:   Efrain Sella, MD    Number of Visits Requested:   1    All questions were answered. The patient knows to call the clinic with any problems questions or concerns.  cc Baxter Hire, MD    Return of visit: 4 months.  Earlie Server, MD, PhD Hematology Oncology [facility] Pager- [phone] 12/14/2020

## 2020-12-19 LAB — MISC LABCORP TEST (SEND OUT): Labcorp test code: 113753

## 2020-12-21 ENCOUNTER — Inpatient Hospital Stay: Payer: Medicare HMO

## 2020-12-21 VITALS — BP 139/53 | HR 61 | Temp 96.2°F | Resp 18

## 2020-12-21 DIAGNOSIS — D509 Iron deficiency anemia, unspecified: Secondary | ICD-10-CM

## 2020-12-21 DIAGNOSIS — C9110 Chronic lymphocytic leukemia of B-cell type not having achieved remission: Secondary | ICD-10-CM | POA: Diagnosis not present

## 2020-12-21 MED ORDER — SODIUM CHLORIDE 0.9 % IV SOLN: 200.0000 mg | Freq: Once | INTRAVENOUS | Status: DC

## 2020-12-21 MED ORDER — SODIUM CHLORIDE 0.9 % IV SOLN
Freq: Once | INTRAVENOUS | Status: AC
Start: 2020-12-21 — End: 2020-12-21
  Filled 2020-12-21: qty 250

## 2020-12-21 MED ORDER — IRON SUCROSE 20 MG/ML IV SOLN
200.0000 mg | Freq: Once | INTRAVENOUS | Status: AC
  Administered 2020-12-21: 200 mg via INTRAVENOUS
  Filled 2020-12-21: qty 10

## 2020-12-26 ENCOUNTER — Encounter: Payer: Self-pay | Admitting: Oncology

## 2021-01-07 ENCOUNTER — Encounter: Payer: Self-pay | Admitting: Oncology

## 2021-03-13 ENCOUNTER — Ambulatory Visit
Admission: RE | Admit: 2021-03-13 | Discharge: 2021-03-13 | Disposition: A | Discharge status: Home / Self Care | Payer: Medicare HMO | Source: Ambulatory Visit | Attending: Otolaryngology | Admitting: Otolaryngology

## 2021-03-13 ENCOUNTER — Other Ambulatory Visit: Payer: Self-pay

## 2021-03-13 DIAGNOSIS — D333 Benign neoplasm of cranial nerves: Secondary | ICD-10-CM

## 2021-03-13 MED ORDER — GADOBUTROL 1 MMOL/ML IV SOLN
7.0000 mL | Freq: Once | INTRAVENOUS | Status: AC | PRN
  Administered 2021-03-13: 7 mL via INTRAVENOUS

## 2021-04-12 ENCOUNTER — Other Ambulatory Visit: Payer: Self-pay

## 2021-04-12 ENCOUNTER — Inpatient Hospital Stay: Payer: Medicare HMO | Attending: Oncology

## 2021-04-12 DIAGNOSIS — C9110 Chronic lymphocytic leukemia of B-cell type not having achieved remission: Secondary | ICD-10-CM | POA: Diagnosis present

## 2021-04-12 DIAGNOSIS — K625 Hemorrhage of anus and rectum: Secondary | ICD-10-CM | POA: Insufficient documentation

## 2021-04-12 DIAGNOSIS — D472 Monoclonal gammopathy: Secondary | ICD-10-CM | POA: Insufficient documentation

## 2021-04-12 DIAGNOSIS — D509 Iron deficiency anemia, unspecified: Secondary | ICD-10-CM | POA: Insufficient documentation

## 2021-04-12 DIAGNOSIS — Z79899 Other long term (current) drug therapy: Secondary | ICD-10-CM | POA: Insufficient documentation

## 2021-04-12 DIAGNOSIS — I2510 Atherosclerotic heart disease of native coronary artery without angina pectoris: Secondary | ICD-10-CM | POA: Insufficient documentation

## 2021-04-12 DIAGNOSIS — R609 Edema, unspecified: Secondary | ICD-10-CM | POA: Insufficient documentation

## 2021-04-12 DIAGNOSIS — K219 Gastro-esophageal reflux disease without esophagitis: Secondary | ICD-10-CM | POA: Diagnosis not present

## 2021-04-12 DIAGNOSIS — M7989 Other specified soft tissue disorders: Secondary | ICD-10-CM | POA: Insufficient documentation

## 2021-04-12 DIAGNOSIS — E1036 Type 1 diabetes mellitus with diabetic cataract: Secondary | ICD-10-CM | POA: Insufficient documentation

## 2021-04-12 DIAGNOSIS — E785 Hyperlipidemia, unspecified: Secondary | ICD-10-CM | POA: Insufficient documentation

## 2021-04-12 DIAGNOSIS — E079 Disorder of thyroid, unspecified: Secondary | ICD-10-CM | POA: Diagnosis not present

## 2021-04-12 DIAGNOSIS — Z7982 Long term (current) use of aspirin: Secondary | ICD-10-CM | POA: Diagnosis not present

## 2021-04-12 DIAGNOSIS — I10 Essential (primary) hypertension: Secondary | ICD-10-CM | POA: Diagnosis not present

## 2021-04-12 DIAGNOSIS — Z800 Family history of malignant neoplasm of digestive organs: Secondary | ICD-10-CM | POA: Diagnosis not present

## 2021-04-12 LAB — CBC WITH DIFFERENTIAL/PLATELET
Abs Immature Granulocytes: 0.09 10*3/uL — ABNORMAL HIGH (ref 0.00–0.07)
Basophils Absolute: 0.1 10*3/uL (ref 0.0–0.1)
Basophils Relative: 1 %
Eosinophils Absolute: 0.7 10*3/uL — ABNORMAL HIGH (ref 0.0–0.5)
Eosinophils Relative: 4 %
HCT: 30.7 % — ABNORMAL LOW (ref 36.0–46.0)
Hemoglobin: 10.4 g/dL — ABNORMAL LOW (ref 12.0–15.0)
Immature Granulocytes: 1 %
Lymphocytes Relative: 68 %
Lymphs Abs: 11.3 10*3/uL — ABNORMAL HIGH (ref 0.7–4.0)
MCH: 30.1 pg (ref 26.0–34.0)
MCHC: 33.9 g/dL (ref 30.0–36.0)
MCV: 89 fL (ref 80.0–100.0)
Monocytes Absolute: 0.8 10*3/uL (ref 0.1–1.0)
Monocytes Relative: 5 %
Neutro Abs: 3.4 10*3/uL (ref 1.7–7.7)
Neutrophils Relative %: 21 %
Platelets: 239 10*3/uL (ref 150–400)
RBC: 3.45 MIL/uL — ABNORMAL LOW (ref 3.87–5.11)
RDW: 13.8 % (ref 11.5–15.5)
Smear Review: NORMAL
WBC: 16.4 10*3/uL — ABNORMAL HIGH (ref 4.0–10.5)
nRBC: 0 % (ref 0.0–0.2)

## 2021-04-12 LAB — COMPREHENSIVE METABOLIC PANEL
ALT: 21 U/L (ref 0–44)
AST: 27 U/L (ref 15–41)
Albumin: 4 g/dL (ref 3.5–5.0)
Alkaline Phosphatase: 53 U/L (ref 38–126)
Anion gap: 9 (ref 5–15)
BUN: 21 mg/dL (ref 8–23)
CO2: 25 mmol/L (ref 22–32)
Calcium: 9.1 mg/dL (ref 8.9–10.3)
Chloride: 98 mmol/L (ref 98–111)
Creatinine, Ser: 0.69 mg/dL (ref 0.44–1.00)
GFR, Estimated: >60 mL/min (ref >60)
Glucose, Bld: 144 mg/dL — ABNORMAL HIGH (ref 70–99)
Potassium: 4.7 mmol/L (ref 3.5–5.1)
Sodium: 132 mmol/L — ABNORMAL LOW (ref 135–145)
Total Bilirubin: 0.9 mg/dL (ref 0.3–1.2)
Total Protein: 6.2 g/dL — ABNORMAL LOW (ref 6.5–8.1)

## 2021-04-15 ENCOUNTER — Inpatient Hospital Stay: Payer: Medicare HMO | Admitting: Oncology

## 2021-04-15 ENCOUNTER — Inpatient Hospital Stay: Payer: Medicare HMO

## 2021-04-15 ENCOUNTER — Other Ambulatory Visit: Payer: Self-pay

## 2021-04-15 ENCOUNTER — Encounter: Payer: Self-pay | Admitting: Oncology

## 2021-04-15 VITALS — BP 143/48 | HR 48 | Temp 98.1°F | Resp 18 | Wt 159.2 lb

## 2021-04-15 VITALS — BP 158/57 | HR 47 | Resp 16

## 2021-04-15 DIAGNOSIS — C9110 Chronic lymphocytic leukemia of B-cell type not having achieved remission: Secondary | ICD-10-CM | POA: Diagnosis not present

## 2021-04-15 DIAGNOSIS — D509 Iron deficiency anemia, unspecified: Secondary | ICD-10-CM

## 2021-04-15 DIAGNOSIS — D472 Monoclonal gammopathy: Secondary | ICD-10-CM

## 2021-04-15 DIAGNOSIS — M7989 Other specified soft tissue disorders: Secondary | ICD-10-CM | POA: Diagnosis not present

## 2021-04-15 LAB — KAPPA/LAMBDA LIGHT CHAINS
Kappa free light chain: 91.1 mg/L — ABNORMAL HIGH (ref 3.3–19.4)
Kappa, lambda light chain ratio: 10.72 — ABNORMAL HIGH (ref 0.26–1.65)
Lambda free light chains: 8.5 mg/L (ref 5.7–26.3)

## 2021-04-15 MED ORDER — IRON SUCROSE 20 MG/ML IV SOLN
200.0000 mg | Freq: Once | INTRAVENOUS | Status: AC
  Administered 2021-04-15: 200 mg via INTRAVENOUS
  Filled 2021-04-15: qty 10

## 2021-04-15 MED ORDER — SODIUM CHLORIDE 0.9 % IV SOLN: 200.0000 mg | Freq: Once | INTRAVENOUS | Status: DC

## 2021-04-15 MED ORDER — SODIUM CHLORIDE 0.9 % IV SOLN
Freq: Once | INTRAVENOUS | Status: AC
  Filled 2021-04-15: qty 250

## 2021-04-15 NOTE — Progress Notes (Signed)
Hematology/Oncology Consult note [facility] Telephone:([phone] Fax:([phone]   Patient Care Team: Baxter Hire, MD as PCP - General (Internal Medicine)  REFERRING PROVIDER: Baxter Hire, MD  CHIEF COMPLAINTS/REASON FOR VISIT:  Follow up for CLL and anemia   HISTORY OF PRESENTING ILLNESS:   Cassandra Gentry is a  87 y.o.  female with PMH listed below was seen in consultation at the request of  Baxter Hire, MD  for evaluation of CLL and anemia  Reports a diagnosis of CLL and she previously follows up with [facility] oncology.  She participated in a clinical trial.  Patient lost to follow-up since the start of COVID-19 pandemic. Patient denies ever being on any treatment for CLL.  Denies any constitutional symptoms 05/04/2020, CBC showed hemoglobin 8.6, hematocrit 27, elevated white count 22.8, MCV 75.6, Patient was referred to hematology to establish care . She follows up with endocrinology for type 1 diabetes and was seen by Dr. Honor Junes on 09/12/2020.  Denies any change of bowel habit, bloody or black stool.  She reports intermittent rectal bleeding which she attributes to hemorrhoid bleeding.  # I obtained and reviewed oncology medical records at [facility]. Last seen by Dr. Dorothea Ogle on 12/01/2017. a. Incidentally noted to have a mild leukocytosis on a routine annual examination, 11/09.  b. Initial hematologic evaluation 07/22/08 shows WBC 13.5, HGB 12.5, PLT 214.  Peripheral blood flow cytometry shows CD5, CD19, CD23, kappa restricted monoclonal B-cell population comprising 38% of peripheral blood events, CD38 negative, consistent with a diagnosis of chronic lymphocytic leukemia.   c. Initial evaluation at [facility] 09/27/08 shows a WBC 13.8, peripheral blood interphase cytogenetics reveals deletion 13q14.3 in 81/200 cells and an immunoglobulin heavy chain translocation with an unknown translocation partner in 165/200 cells.   INTERVAL HISTORY Cassandra Gentry  is a 87 y.o. female who has above history reviewed by me today presents for follow up visit for CLL, iron deficiency anemia, MGUS Problems and complaints are listed below She feels well today. She has upcoming appointment with GI in Sept 2022.  No new complaints. Fatigue has improved after previous IV venofer.  New left lower extremity swelling, for 1 week, no calf tenderness  :Review of Systems  Constitutional:  Positive for fatigue. Negative for appetite change, chills and fever.  HENT:   Negative for hearing loss and voice change.   Eyes:  Negative for eye problems.  Respiratory:  Negative for chest tightness and cough.   Cardiovascular:  Negative for chest pain.  Gastrointestinal:  Negative for abdominal distention, abdominal pain and blood in stool.  Endocrine: Negative for hot flashes.  Genitourinary:  Negative for difficulty urinating and frequency.   Musculoskeletal:  Negative for arthralgias.  Skin:  Negative for itching and rash.  Neurological:  Negative for extremity weakness.  Hematological:  Negative for adenopathy.  Psychiatric/Behavioral:  Negative for confusion.    MEDICAL HISTORY:  Past Medical History:  Diagnosis Date   Chronic lymphocytic leukemia ([place])    Complication of anesthesia    SICK X 3 DAYS AFTER LAST CATARACT. LM FOR HER TO CALL BACK WITH MORE INFO   Coronary artery disease    Diabetes mellitus without complication (HCC)    Diverticulosis    Dysrhythmia    Edema    LEFT LEG   GERD (gastroesophageal reflux disease)    Hyperlipidemia    Hypertension    Thyroid disease     SURGICAL HISTORY: Past Surgical History:  Procedure Laterality Date  CATARACT EXTRACTION W/PHACO Left 06/08/2018   Procedure: CATARACT EXTRACTION PHACO AND INTRAOCULAR LENS PLACEMENT (IOC);  Surgeon: Birder Robson, MD;  Location: [facility] ORS;  Service: Ophthalmology;  Laterality: Left;  [place] 02:20 CDE 23.41 Fluid pack lot # [id number]   CATARACT EXTRACTION W/PHACO Right  07/13/2018   Procedure: CATARACT EXTRACTION PHACO AND INTRAOCULAR LENS PLACEMENT (IOC);  Surgeon: Birder Robson, MD;  Location: [facility] ORS;  Service: Ophthalmology;  Laterality: Right;  [place] 01:18.2 CDE 15.42 Fluid Pack Lot # [id number]   CORONARY ARTERY BYPASS GRAFT  2000    SOCIAL HISTORY: Social History   Socioeconomic History   Marital status: Married    Spouse name: Not on file   Number of children: Not on file   Years of education: Not on file   Highest education level: Not on file  Occupational History   Not on file  Tobacco Use   Smoking status: Never   Smokeless tobacco: Never  Vaping Use   Vaping Use: Never used  Substance and Sexual Activity   Alcohol use: Never   Drug use: Never   Sexual activity: Not on file  Other Topics Concern   Not on file  Social History Narrative   Not on file   Social Determinants of Health   Financial Resource Strain: Not on file  Food Insecurity: Not on file  Transportation Needs: Not on file  Physical Activity: Not on file  Stress: Not on file  Social Connections: Not on file  Intimate Partner Violence: Not on file    FAMILY HISTORY: Family History  Problem Relation Age of Onset   Parkinson's disease Mother    Heart disease Father    Stomach cancer Maternal Uncle     ALLERGIES:  is allergic to amoxicillin-pot clavulanate, other, shellfish allergy, and simvastatin.  MEDICATIONS:  Current Outpatient Medications  Medication Sig Dispense Refill   acetaminophen (TYLENOL) 500 MG tablet Take 1,000 mg by mouth every 6 (six) hours as needed for moderate pain or headache.      alendronate (FOSAMAX) 70 MG tablet Take by mouth. Take 1 tablet (70 mg total) by mouth every 7 (seven) days Take with a full glass of water. Do not lie down for the next 30 min.     Ascorbic Acid (VITAMIN C) 1000 MG tablet Take 1,000 mg by mouth daily after supper.      aspirin 325 MG tablet Take 325 mg by mouth every evening.      chlorthalidone (HYGROTON)  25 MG tablet Take 12.5 mg by mouth every Monday, Wednesday, and Friday.      cholecalciferol (VITAMIN D) 1000 units tablet Take 1,000 Units by mouth daily after supper.      ezetimibe (ZETIA) 10 MG tablet Take 10 mg by mouth at bedtime.      fluocinonide cream (LIDEX) 8.32 % Apply 1 application topically 3 (three) times daily as needed (for itching).      gabapentin (NEURONTIN) 100 MG capsule Take 100 mg by mouth daily.     Glucosamine-Chondroitin (OSTEO BI-FLEX REGULAR STRENGTH PO) Take 1 tablet by mouth daily after supper.     insulin aspart (NOVOLOG) 100 UNIT/ML injection Inject into the skin daily. Via insulin pump     losartan (COZAAR) 50 MG tablet Take 50 mg by mouth daily.      metoprolol succinate (TOPROL-XL) 50 MG 24 hr tablet Take 25 mg by mouth 2 (two) times daily.      mometasone (ELOCON) 0.1 % lotion Apply 4 drops topically  2 (two) times a week. Into ears     Multiple Vitamin (MULTIVITAMIN WITH MINERALS) TABS tablet Take 1 tablet by mouth daily. Multivitamin for Women 50+     NATURAL PSYLLIUM SEED PO Take 2 capsules by mouth daily at 3 pm.      Omega-3 Fatty Acids (FISH OIL) 1200 MG CAPS Take 1,200 mg by mouth every evening.     pantoprazole (PROTONIX) 40 MG tablet Take 40 mg by mouth daily before breakfast.      Probiotic Product (PROBIOTIC DAILY PO) Take 1 capsule by mouth daily after supper.     Insulin Human (INSULIN PUMP) SOLN Inject into the skin. insulin aspart (NOVOLOG) 100 unit/ml (Patient not taking: Reported on 04/15/2021)     No current facility-administered medications for this visit.     PHYSICAL EXAMINATION: ECOG PERFORMANCE STATUS: 1 - Symptomatic but completely ambulatory Vitals:   04/15/21 1321  BP: (!) 143/48  Pulse: (!) 48  Resp: 18  Temp: 98.1 F (36.7 C)   Filed Weights   04/15/21 1321  Weight: 159 lb 3.2 oz (72.2 kg)    Physical Exam Constitutional:      General: She is not in acute distress. HENT:     Head: Normocephalic and atraumatic.   Eyes:     General: No scleral icterus. Cardiovascular:     Rate and Rhythm: Normal rate and regular rhythm.     Heart sounds: Normal heart sounds.  Pulmonary:     Effort: Pulmonary effort is normal. No respiratory distress.     Breath sounds: No wheezing.  Abdominal:     General: Bowel sounds are normal. There is no distension.     Palpations: Abdomen is soft.  Musculoskeletal:        General: No deformity. Normal range of motion.     Cervical back: Normal range of motion and neck supple.  Skin:    General: Skin is warm and dry.     Findings: No erythema or rash.  Neurological:     Mental Status: She is alert and oriented to person, place, and time. Mental status is at baseline.     Cranial Nerves: No cranial nerve deficit.     Coordination: Coordination normal.  Psychiatric:        Mood and Affect: Mood normal.    LABORATORY DATA:  I have reviewed the data as listed Lab Results  Component Value Date   WBC 16.4 (H) 04/12/2021   HGB 10.4 (L) 04/12/2021   HCT 30.7 (L) 04/12/2021   MCV 89.0 04/12/2021   PLT 239 04/12/2021   Recent Labs    10/03/20 1540 04/12/21 1124  NA 126* 132*  K 4.2 4.7  CL 93* 98  CO2 23 25  GLUCOSE 207* 144*  BUN 22 21  CREATININE 0.89 0.69  CALCIUM 8.8* 9.1  GFRNONAA >60 >60  PROT 6.9 6.2*  ALBUMIN 3.8 4.0  AST 28 27  ALT 23 21  ALKPHOS 76 53  BILITOT 0.8 0.9    Iron/TIBC/Ferritin/ %Sat    Component Value Date/Time   IRON 57 12/12/2020 1139   TIBC 372 12/12/2020 1139   FERRITIN 46 12/12/2020 1139   IRONPCTSAT 15 12/12/2020 1139      RADIOGRAPHIC STUDIES: I have personally reviewed the radiological images as listed and agreed with the findings in the report. MR BRAIN/IAC W WO CONTRAST  Result Date: 03/14/2021 CLINICAL DATA:  Acoustic neuroma follow-up.  Hearing loss left ear EXAM: MRI HEAD WITHOUT AND WITH CONTRAST  TECHNIQUE: Multiplanar, multiecho pulse sequences of the brain and surrounding structures were obtained without  and with intravenous contrast. CONTRAST:  65m GADAVIST GADOBUTROL 1 MMOL/ML IV SOLN COMPARISON:  MRI head 03/12/2020 FINDINGS: Brain: IAC protocol. Enhancing mass in the left cerebellar pontine angle extending into the left internal auditory canal is unchanged. This is extends deep into the internal auditory canal. No significant mass-effect on the brainstem. Overall the mass measures 7 x 12.5 mm, unchanged in size. Right internal auditory canal normal. Brainstem and cerebellum normal. Generalized mild atrophy. Mild white matter changes, stable from the prior study. Negative for acute infarct, hemorrhage, mass. Vascular: Normal arterial flow voids. Skull and upper cervical spine: Negative Sinuses/Orbits: Extensive mucosal edema right maxillary sinus with progression. Mild mucosal edema in the ethmoid sinuses bilaterally. Mild left mastoid effusion with progression. 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 No mass-effect on the brainstem Progressive sinus mucosal disease involving the right maxillary sinus. Progressive left mastoid sinus effusion. Electronically Signed   By: CFranchot GalloM.D.   On: 03/14/2021 14:49      ASSESSMENT & PLAN:  1. Iron deficiency anemia, unspecified iron deficiency anemia type   2. MGUS (monoclonal gammopathy of unknown significance)   3. [place]) [place]   4. 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  No consitutional symptoms, continue watchful waiting.    IGVH test was not successfl due to low B cell clonal band.    #Iron deficiency anemia,  Hemoglobin continues to improved, although not normalized yet.  Proceed with another dose of IV Venofer 2061mx 1 Pending GI work up for etiology of IDA>   #IgM MGUS, Multiple myeloma panel showed IgM monoclonal protein with kappa light  chain specificity.  M protein 0.1, Light chain ratio remains elevated at 10.72   Urine protein electrophoresis and immunofixation showed Benders protein kappa type. She has a normal NT proBNP in 400s.   # Left lower extremity swelling check [place] rule out DVT.    Orders Placed This Encounter  Procedures   [place] Img Lower Unilateral Left    Standing Status:   Future    Standing Expiration Date:   04/15/2022    Order Specific Question:   Reason for Exam (SYMPTOM  OR DIAGNOSIS REQUIRED)    Answer:   left lower extremity swelling    Order Specific Question:   Preferred imaging location?    Answer:   [facility]    Order Specific Question:   Call Results- Best Contact Number?    Answer:   md will check chart- pt can leave   CBC with Differential/Platelet    Standing Status:   Future    Standing Expiration Date:   04/15/2022   Ferritin    Standing Status:   Future    Standing Expiration Date:   04/15/2022   Iron and TIBC    Standing Status:   Future    Standing Expiration Date:   04/15/2022    All questions were answered. The patient knows to call the clinic with any problems questions or concerns.  cc JoBaxter HireMD    Return of visit: 3 months.  ZhEarlie ServerMD, PhD Hematology Oncology [facility] Gastroenterology[facility]- [phone]/15/2022

## 2021-04-15 NOTE — Progress Notes (Signed)
Pt here for follow up. No new concerns voiced.   

## 2021-04-16 ENCOUNTER — Ambulatory Visit
Admission: RE | Admit: 2021-04-16 | Discharge: 2021-04-16 | Disposition: A | Discharge status: Home / Self Care | Payer: Medicare HMO | Source: Ambulatory Visit | Attending: Oncology | Admitting: Oncology

## 2021-04-16 ENCOUNTER — Ambulatory Visit: Payer: Medicare HMO | Admitting: Oncology

## 2021-04-16 ENCOUNTER — Ambulatory Visit: Payer: Medicare HMO

## 2021-04-16 DIAGNOSIS — D509 Iron deficiency anemia, unspecified: Secondary | ICD-10-CM | POA: Diagnosis present

## 2021-04-16 LAB — MULTIPLE MYELOMA PANEL, SERUM
Albumin SerPl Elph-Mcnc: 3.7 g/dL (ref 2.9–4.4)
Albumin/Glob SerPl: 1.8 — ABNORMAL HIGH (ref 0.7–1.7)
Alpha 1: 0.2 g/dL (ref 0.0–0.4)
Alpha2 Glob SerPl Elph-Mcnc: 0.8 g/dL (ref 0.4–1.0)
B-Globulin SerPl Elph-Mcnc: 0.8 g/dL (ref 0.7–1.3)
Gamma Glob SerPl Elph-Mcnc: 0.4 g/dL (ref 0.4–1.8)
Globulin, Total: 2.1 g/dL — ABNORMAL LOW (ref 2.2–3.9)
IgA: 34 mg/dL — ABNORMAL LOW (ref 64–422)
IgG (Immunoglobin G), Serum: 386 mg/dL — ABNORMAL LOW (ref 586–1602)
IgM (Immunoglobulin M), Srm: 200 mg/dL (ref 26–217)
Total Protein ELP: 5.8 g/dL — ABNORMAL LOW (ref 6.0–8.5)

## 2021-07-02 ENCOUNTER — Other Ambulatory Visit: Payer: Self-pay | Admitting: Physical Medicine & Rehabilitation

## 2021-07-02 DIAGNOSIS — M5441 Lumbago with sciatica, right side: Secondary | ICD-10-CM

## 2021-07-02 DIAGNOSIS — G8929 Other chronic pain: Secondary | ICD-10-CM

## 2021-07-10 ENCOUNTER — Ambulatory Visit
Admission: RE | Admit: 2021-07-10 | Discharge: 2021-07-10 | Disposition: A | Discharge status: Home / Self Care | Payer: Medicare HMO | Source: Ambulatory Visit | Attending: Physical Medicine & Rehabilitation | Admitting: Physical Medicine & Rehabilitation

## 2021-07-10 ENCOUNTER — Other Ambulatory Visit: Payer: Self-pay

## 2021-07-10 DIAGNOSIS — G8929 Other chronic pain: Secondary | ICD-10-CM

## 2021-07-10 DIAGNOSIS — M5442 Lumbago with sciatica, left side: Secondary | ICD-10-CM | POA: Diagnosis present

## 2021-07-10 DIAGNOSIS — M5441 Lumbago with sciatica, right side: Secondary | ICD-10-CM | POA: Insufficient documentation

## 2021-07-12 ENCOUNTER — Other Ambulatory Visit: Payer: Self-pay

## 2021-07-12 DIAGNOSIS — D509 Iron deficiency anemia, unspecified: Secondary | ICD-10-CM

## 2021-07-15 ENCOUNTER — Other Ambulatory Visit: Payer: Self-pay

## 2021-07-15 ENCOUNTER — Inpatient Hospital Stay: Payer: Medicare HMO | Attending: Oncology

## 2021-07-15 DIAGNOSIS — I10 Essential (primary) hypertension: Secondary | ICD-10-CM | POA: Insufficient documentation

## 2021-07-15 DIAGNOSIS — D509 Iron deficiency anemia, unspecified: Secondary | ICD-10-CM | POA: Insufficient documentation

## 2021-07-15 DIAGNOSIS — E1036 Type 1 diabetes mellitus with diabetic cataract: Secondary | ICD-10-CM | POA: Diagnosis not present

## 2021-07-15 DIAGNOSIS — Z800 Family history of malignant neoplasm of digestive organs: Secondary | ICD-10-CM | POA: Insufficient documentation

## 2021-07-15 DIAGNOSIS — C9110 Chronic lymphocytic leukemia of B-cell type not having achieved remission: Secondary | ICD-10-CM | POA: Insufficient documentation

## 2021-07-15 DIAGNOSIS — D472 Monoclonal gammopathy: Secondary | ICD-10-CM | POA: Diagnosis not present

## 2021-07-15 LAB — CBC WITH DIFFERENTIAL/PLATELET
Abs Immature Granulocytes: 0.07 10*3/uL (ref 0.00–0.07)
Basophils Absolute: 0.1 10*3/uL (ref 0.0–0.1)
Basophils Relative: 1 %
Eosinophils Absolute: 0.4 10*3/uL (ref 0.0–0.5)
Eosinophils Relative: 3 %
HCT: 31.3 % — ABNORMAL LOW (ref 36.0–46.0)
Hemoglobin: 10.8 g/dL — ABNORMAL LOW (ref 12.0–15.0)
Immature Granulocytes: 0 %
Lymphocytes Relative: 71 %
Lymphs Abs: 11.4 10*3/uL — ABNORMAL HIGH (ref 0.7–4.0)
MCH: 30 pg (ref 26.0–34.0)
MCHC: 34.5 g/dL (ref 30.0–36.0)
MCV: 86.9 fL (ref 80.0–100.0)
Monocytes Absolute: 0.7 10*3/uL (ref 0.1–1.0)
Monocytes Relative: 4 %
Neutro Abs: 3.4 10*3/uL (ref 1.7–7.7)
Neutrophils Relative %: 21 %
Platelets: 285 10*3/uL (ref 150–400)
RBC: 3.6 MIL/uL — ABNORMAL LOW (ref 3.87–5.11)
RDW: 13.5 % (ref 11.5–15.5)
WBC: 16 10*3/uL — ABNORMAL HIGH (ref 4.0–10.5)
nRBC: 0 % (ref 0.0–0.2)

## 2021-07-15 LAB — FERRITIN: Ferritin: 41 ng/mL (ref 11–307)

## 2021-07-15 LAB — RETIC PANEL
Immature Retic Fract: 10.4 % (ref 2.3–15.9)
RBC.: 3.62 MIL/uL — ABNORMAL LOW (ref 3.87–5.11)
Retic Count, Absolute: 82.2 10*3/uL (ref 19.0–186.0)
Retic Ct Pct: 2.3 % (ref 0.4–3.1)
Reticulocyte Hemoglobin: 34 pg (ref >27.9)

## 2021-07-15 LAB — IRON AND TIBC
Iron: 68 ug/dL (ref 28–170)
Saturation Ratios: 20 % (ref 10.4–31.8)
TIBC: 343 ug/dL (ref 250–450)
UIBC: 275 ug/dL

## 2021-07-16 ENCOUNTER — Inpatient Hospital Stay: Payer: Medicare HMO | Admitting: Oncology

## 2021-07-16 ENCOUNTER — Inpatient Hospital Stay: Payer: Medicare HMO

## 2021-07-16 ENCOUNTER — Encounter: Payer: Self-pay | Admitting: Oncology

## 2021-07-16 VITALS — BP 151/76 | HR 48 | Temp 97.2°F | Wt 155.3 lb

## 2021-07-16 DIAGNOSIS — D509 Iron deficiency anemia, unspecified: Secondary | ICD-10-CM

## 2021-07-16 DIAGNOSIS — D472 Monoclonal gammopathy: Secondary | ICD-10-CM

## 2021-07-16 DIAGNOSIS — C9110 Chronic lymphocytic leukemia of B-cell type not having achieved remission: Secondary | ICD-10-CM

## 2021-07-16 DIAGNOSIS — R803 Bence Jones proteinuria: Secondary | ICD-10-CM

## 2021-07-16 MED ORDER — FERROUS SULFATE 325 (65 FE) MG PO TBEC: 325.0000 mg | DELAYED_RELEASE_TABLET | Freq: Every day | ORAL | 1 refills | Status: AC

## 2021-07-16 NOTE — Progress Notes (Signed)
Hematology/Oncology Progress  note [phone] [phone]   Patient Care Team: Baxter Hire, MD as PCP - General (Internal Medicine)  REFERRING PROVIDER: Baxter Hire, MD  CHIEF COMPLAINTS/REASON FOR VISIT:  Follow up for CLL and anemia   HISTORY OF PRESENTING ILLNESS:   Cassandra Gentry is a  87 y.o.  female with PMH listed below was seen in consultation at the request of  Baxter Hire, MD  for evaluation of CLL and anemia  Reports a diagnosis of CLL and she previously follows up with [facility] oncology.  She participated in a clinical trial.  Patient lost to follow-up since the start of COVID-19 pandemic. Patient denies ever being on any treatment for CLL.  Denies any constitutional symptoms 05/04/2020, CBC showed hemoglobin 8.6, hematocrit 27, elevated white count 22.8, MCV 75.6, Patient was referred to hematology to establish care . She follows up with endocrinology for type 1 diabetes and was seen by Dr. Honor Junes on 09/12/2020.  Denies any change of bowel habit, bloody or black stool.  She reports intermittent rectal bleeding which she attributes to hemorrhoid bleeding.  # I obtained and reviewed oncology medical records at [facility]. Last seen by Dr. Dorothea Ogle on 12/01/2017. a. Incidentally noted to have a mild leukocytosis on a routine annual examination, 11/09.  b. Initial hematologic evaluation 07/22/08 shows WBC 13.5, HGB 12.5, PLT 214.  Peripheral blood flow cytometry shows CD5, CD19, CD23, kappa restricted monoclonal B-cell population comprising 38% of peripheral blood events, CD38 negative, consistent with a diagnosis of chronic lymphocytic leukemia.   c. Initial evaluation at [facility] 09/27/08 shows a WBC 13.8, peripheral blood interphase cytogenetics reveals deletion 13q14.3 in 81/200 cells and an immunoglobulin heavy chain translocation with an unknown translocation partner in 165/200 cells.   INTERVAL HISTORY Cassandra Gentry is a 87 y.o. female who has  above history reviewed by me today presents for follow up visit for CLL, iron deficiency anemia, MGUS Patient reports feeling well.  She has to reschedule her colonoscopy and she has an appointment in January 2023 Patient takes oral iron supplementation. No new complaints.  Fatigue is at baseline, not worse.   :Review of Systems  Constitutional:  Positive for fatigue. Negative for appetite change, chills and fever.  HENT:   Negative for hearing loss and voice change.   Eyes:  Negative for eye problems.  Respiratory:  Negative for chest tightness and cough.   Cardiovascular:  Negative for chest pain.  Gastrointestinal:  Negative for abdominal distention, abdominal pain and blood in stool.  Endocrine: Negative for hot flashes.  Genitourinary:  Negative for difficulty urinating and frequency.   Musculoskeletal:  Negative for arthralgias.  Skin:  Negative for itching and rash.  Neurological:  Negative for extremity weakness.  Hematological:  Negative for adenopathy.  Psychiatric/Behavioral:  Negative for confusion.    MEDICAL HISTORY:  Past Medical History:  Diagnosis Date   Chronic lymphocytic leukemia ([place])    Complication of anesthesia    SICK X 3 DAYS AFTER LAST CATARACT. LM FOR HER TO CALL BACK WITH MORE INFO   Coronary artery disease    Diabetes mellitus without complication (HCC)    Diverticulosis    Dysrhythmia    Edema    LEFT LEG   GERD (gastroesophageal reflux disease)    Hyperlipidemia    Hypertension    Thyroid disease     SURGICAL HISTORY: Past Surgical History:  Procedure Laterality Date   CATARACT EXTRACTION W/PHACO Left 06/08/2018   Procedure:  CATARACT EXTRACTION PHACO AND INTRAOCULAR LENS PLACEMENT (IOC);  Surgeon: Birder Robson, MD;  Location: [facility] ORS;  Service: Ophthalmology;  Laterality: Left;  [place] 02:20 CDE 23.41 Fluid pack lot # [id number]   CATARACT EXTRACTION W/PHACO Right 07/13/2018   Procedure: CATARACT EXTRACTION PHACO AND INTRAOCULAR LENS  PLACEMENT (IOC);  Surgeon: Birder Robson, MD;  Location: [facility] ORS;  Service: Ophthalmology;  Laterality: Right;  [place] 01:18.2 CDE 15.42 Fluid Pack Lot # [id number]   CORONARY ARTERY BYPASS GRAFT  2000    SOCIAL HISTORY: Social History   Socioeconomic History   Marital status: Married    Spouse name: Not on file   Number of children: Not on file   Years of education: Not on file   Highest education level: Not on file  Occupational History   Not on file  Tobacco Use   Smoking status: Never   Smokeless tobacco: Never  Vaping Use   Vaping Use: Never used  Substance and Sexual Activity   Alcohol use: Never   Drug use: Never   Sexual activity: Not on file  Other Topics Concern   Not on file  Social History Narrative   Not on file   Social Determinants of Health   Financial Resource Strain: Not on file  Food Insecurity: Not on file  Transportation Needs: Not on file  Physical Activity: Not on file  Stress: Not on file  Social Connections: Not on file  Intimate Partner Violence: Not on file    FAMILY HISTORY: Family History  Problem Relation Age of Onset   Parkinson's disease Mother    Heart disease Father    Stomach cancer Maternal Uncle     ALLERGIES:  is allergic to amoxicillin-pot clavulanate, other, shellfish allergy, and simvastatin.  MEDICATIONS:  Current Outpatient Medications  Medication Sig Dispense Refill   acetaminophen (TYLENOL) 500 MG tablet Take 1,000 mg by mouth every 6 (six) hours as needed for moderate pain or headache.      Ascorbic Acid (VITAMIN C) 1000 MG tablet Take 1,000 mg by mouth daily after supper.      aspirin 325 MG tablet Take 325 mg by mouth every evening.      chlorthalidone (HYGROTON) 25 MG tablet Take 12.5 mg by mouth every Monday, Wednesday, and Friday.      cholecalciferol (VITAMIN D) 1000 units tablet Take 1,000 Units by mouth daily after supper.      ezetimibe (ZETIA) 10 MG tablet Take 10 mg by mouth at bedtime.      ferrous  sulfate 325 (65 FE) MG EC tablet Take 1 tablet (325 mg total) by mouth daily. 90 tablet 1   fluocinonide cream (LIDEX) 0.26 % Apply 1 application topically 3 (three) times daily as needed (for itching).      gabapentin (NEURONTIN) 100 MG capsule Take 100 mg by mouth daily.     Glucosamine-Chondroitin (OSTEO BI-FLEX REGULAR STRENGTH PO) Take 1 tablet by mouth daily after supper.     insulin aspart (NOVOLOG) 100 UNIT/ML injection Inject into the skin daily. Via insulin pump     Insulin Human (INSULIN PUMP) SOLN Inject into the skin. insulin aspart (NOVOLOG) 100 unit/ml     losartan (COZAAR) 50 MG tablet Take 50 mg by mouth daily.      metoprolol succinate (TOPROL-XL) 50 MG 24 hr tablet Take 25 mg by mouth 2 (two) times daily.      mometasone (ELOCON) 0.1 % lotion Apply 4 drops topically 2 (two) times a week. Into ears  Multiple Vitamin (MULTIVITAMIN WITH MINERALS) TABS tablet Take 1 tablet by mouth daily. Multivitamin for Women 50+     NATURAL PSYLLIUM SEED PO Take 2 capsules by mouth daily at 3 pm.      Omega-3 Fatty Acids (FISH OIL) 1200 MG CAPS Take 1,200 mg by mouth every evening.     pantoprazole (PROTONIX) 40 MG tablet Take 40 mg by mouth daily before breakfast.      Probiotic Product (PROBIOTIC DAILY PO) Take 1 capsule by mouth daily after supper.     No current facility-administered medications for this visit.     PHYSICAL EXAMINATION: ECOG PERFORMANCE STATUS: 1 - Symptomatic but completely ambulatory Vitals:   07/16/21 1309  BP: (!) 151/76  Pulse: (!) 48  Temp: (!) 97.2 F (36.2 C)   Filed Weights   07/16/21 1309  Weight: 155 lb 4.8 oz (70.4 kg)    Physical Exam Constitutional:      General: She is not in acute distress. HENT:     Head: Normocephalic and atraumatic.  Eyes:     General: No scleral icterus. Cardiovascular:     Rate and Rhythm: Normal rate and regular rhythm.     Heart sounds: Normal heart sounds.  Pulmonary:     Effort: Pulmonary effort is normal.  No respiratory distress.     Breath sounds: No wheezing.  Abdominal:     General: Bowel sounds are normal. There is no distension.     Palpations: Abdomen is soft.  Musculoskeletal:        General: No deformity. Normal range of motion.     Cervical back: Normal range of motion and neck supple.  Skin:    General: Skin is warm and dry.     Findings: No erythema or rash.  Neurological:     Mental Status: She is alert and oriented to person, place, and time. Mental status is at baseline.     Cranial Nerves: No cranial nerve deficit.     Coordination: Coordination normal.  Psychiatric:        Mood and Affect: Mood normal.    LABORATORY DATA:  I have reviewed the data as listed Lab Results  Component Value Date   WBC 16.0 (H) 07/15/2021   HGB 10.8 (L) 07/15/2021   HCT 31.3 (L) 07/15/2021   MCV 86.9 07/15/2021   PLT 285 07/15/2021   Recent Labs    10/03/20 1540 04/12/21 1124  NA 126* 132*  K 4.2 4.7  CL 93* 98  CO2 23 25  GLUCOSE 207* 144*  BUN 22 21  CREATININE 0.89 0.69  CALCIUM 8.8* 9.1  GFRNONAA >60 >60  PROT 6.9 6.2*  ALBUMIN 3.8 4.0  AST 28 27  ALT 23 21  ALKPHOS 76 53  BILITOT 0.8 0.9    Iron/TIBC/Ferritin/ %Sat    Component Value Date/Time   IRON 68 07/15/2021 1154   TIBC 343 07/15/2021 1154   FERRITIN 41 07/15/2021 1154   IRONPCTSAT 20 07/15/2021 1154      RADIOGRAPHIC STUDIES: I have personally reviewed the radiological images as listed and agreed with the findings in the report. MR LUMBAR SPINE WO CONTRAST  Result Date: 07/10/2021 CLINICAL DATA:  Low back with left hip pain. No known injury. No history of surgeries reported. EXAM: MRI LUMBAR SPINE WITHOUT CONTRAST TECHNIQUE: Multiplanar, multisequence MR imaging of the lumbar spine was performed. No intravenous contrast was administered. COMPARISON:  None. FINDINGS: Segmentation:  Standard. Alignment: 2 mm retrolisthesis of L2 on L3. Minimal  grade 1 anterolisthesis of L5 on S1. Dextroscoliosis of  the thoracolumbar spine. Vertebrae: No acute fracture, evidence of discitis, or aggressive bone lesion. Conus medullaris and cauda equina: Conus extends to the T12-L1 level. Conus and cauda equina appear normal. Paraspinal and other soft tissues: No acute paraspinal abnormality. Disc levels: Disc spaces: Degenerative disease with disc height loss at T11-12, T12-L1, L1-2 and L2-3. Disc desiccation at L3-4 and L4-5. T12-L1: Broad-based disc bulge. Mild bilateral facet arthropathy. Mild left foraminal stenosis. No right foraminal stenosis. Mild spinal stenosis. L1-L2: Broad-based disc bulge with a left lateral disc osteophyte complex. Small right foraminal disc protrusion. Mild bilateral facet arthropathy. Severe left foraminal stenosis. Scratch them moderate left foraminal stenosis. Severe right foraminal stenosis. L2-L3: Mild broad-based disc bulge. Mild bilateral facet arthropathy. No foraminal stenosis. Mild spinal stenosis. L3-L4: Broad-based disc bulge. No foraminal or central canal stenosis. L4-L5: Broad-based disc bulge. Mild bilateral facet arthropathy. No foraminal or central canal stenosis. L5-S1: No significant disc bulge. No neural foraminal stenosis. No central canal stenosis. Severe bilateral facet arthropathy with bilateral facet effusions. IMPRESSION: 1. Diffuse lumbar spine spondylosis as described above. Electronically Signed   By: Kathreen Devoid M.D.   On: 07/10/2021 11:14      ASSESSMENT & PLAN:  1. Iron deficiency anemia, unspecified iron deficiency anemia type   2. MGUS (monoclonal gammopathy of unknown significance)   3. 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  No consitutional symptoms, continue watchful waiting.    IGVH test was not successfl due to low B cell clonal band.   Labs reviewed and discussed with patient .  Stable total white blood count of 16, patient has no B symptoms.  Continue watchful waiting.  #Iron  deficiency anemia,  Hemoglobin continues to improved, although not normalized yet.  Continue oral iron supplementation.  I will hold off IV Venofer. Patient needs to follow-up with gastroenterology for further work-up of iron deficiency   #IgM MGUS, Multiple myeloma panel showed IgM monoclonal protein with kappa light chain specificity.  M protein 0.1, Light chain ratio remains elevated at 10.72  Urine protein electrophoresis and immunofixation showed Bence Jones protein kappa type. She has a normal NT proBNP in 400s.  I will repeat levels at the next visit in 3 months.    Orders Placed This Encounter  Procedures   CBC with Differential/Platelet    Standing Status:   Future    Standing Expiration Date:   07/16/2022   Comprehensive metabolic panel    Standing Status:   Future    Standing Expiration Date:   07/16/2022   IFE+PROTEIN ELECTRO, 24-HR UR    Standing Status:   Future    Standing Expiration Date:   07/16/2022   Kappa/lambda light chains    Standing Status:   Future    Standing Expiration Date:   07/16/2022   Multiple Myeloma Panel (SPEP&IFE w/QIG)    Standing Status:   Future    Standing Expiration Date:   07/16/2022    All questions were answered. The patient knows to call the clinic with any problems questions or concerns.  cc Baxter Hire, MD    Return of visit: 3 months.  Earlie Server, MD, PhD 07/16/2021

## 2021-10-01 ENCOUNTER — Encounter: Payer: Self-pay | Admitting: Internal Medicine

## 2021-10-02 ENCOUNTER — Ambulatory Visit: Payer: Medicare HMO | Admitting: Anesthesiology

## 2021-10-02 ENCOUNTER — Encounter: Payer: Self-pay | Admitting: Internal Medicine

## 2021-10-02 ENCOUNTER — Encounter
Admission: RE | Disposition: A | Discharge status: Home / Self Care | Payer: Self-pay | Source: Home / Self Care | Attending: Internal Medicine

## 2021-10-02 ENCOUNTER — Ambulatory Visit
Admission: RE | Admit: 2021-10-02 | Discharge: 2021-10-02 | Disposition: A | Discharge status: Home / Self Care | Payer: Medicare HMO | Attending: Internal Medicine | Admitting: Internal Medicine

## 2021-10-02 DIAGNOSIS — K640 First degree hemorrhoids: Secondary | ICD-10-CM | POA: Diagnosis not present

## 2021-10-02 DIAGNOSIS — K5730 Diverticulosis of large intestine without perforation or abscess without bleeding: Secondary | ICD-10-CM | POA: Insufficient documentation

## 2021-10-02 DIAGNOSIS — K2100 Gastro-esophageal reflux disease with esophagitis, without bleeding: Secondary | ICD-10-CM | POA: Insufficient documentation

## 2021-10-02 DIAGNOSIS — D500 Iron deficiency anemia secondary to blood loss (chronic): Secondary | ICD-10-CM | POA: Insufficient documentation

## 2021-10-02 DIAGNOSIS — C9110 Chronic lymphocytic leukemia of B-cell type not having achieved remission: Secondary | ICD-10-CM | POA: Diagnosis not present

## 2021-10-02 DIAGNOSIS — D120 Benign neoplasm of cecum: Secondary | ICD-10-CM | POA: Insufficient documentation

## 2021-10-02 DIAGNOSIS — K449 Diaphragmatic hernia without obstruction or gangrene: Secondary | ICD-10-CM | POA: Diagnosis not present

## 2021-10-02 HISTORY — PX: COLONOSCOPY WITH PROPOFOL: SHX5780

## 2021-10-02 HISTORY — PX: ESOPHAGOGASTRODUODENOSCOPY: SHX5428

## 2021-10-02 SURGERY — COLONOSCOPY WITH PROPOFOL
Anesthesia: General

## 2021-10-02 MED ORDER — LIDOCAINE HCL (CARDIAC) PF 100 MG/5ML IV SOSY
PREFILLED_SYRINGE | INTRAVENOUS | Status: DC | PRN
  Administered 2021-10-02: 50 mg via INTRAVENOUS

## 2021-10-02 MED ORDER — PROPOFOL 500 MG/50ML IV EMUL
INTRAVENOUS | Status: AC
  Filled 2021-10-02: qty 50

## 2021-10-02 MED ORDER — SODIUM CHLORIDE 0.9 % IV SOLN: INTRAVENOUS | Status: DC

## 2021-10-02 MED ORDER — PROPOFOL 10 MG/ML IV BOLUS
INTRAVENOUS | Status: DC | PRN
  Administered 2021-10-02 (×2): 50 mg via INTRAVENOUS
  Administered 2021-10-02: 20 mg via INTRAVENOUS

## 2021-10-02 MED ORDER — PROPOFOL 500 MG/50ML IV EMUL
INTRAVENOUS | Status: DC | PRN
  Administered 2021-10-02: 70 ug/kg/min via INTRAVENOUS

## 2021-10-02 NOTE — Op Note (Signed)
[facility] Patient Name: Cassandra Gentry Procedure Date: 10/02/2021 10:12 AM MRN: [id number] Account #: [id number] Date of Birth: [birth year] Admit Type: Outpatient Age: 88 Room: [facility] ENDO ROOM 2 Gender: Female Note Status: Finalized Instrument Name: Upper Endoscope [id number] Procedure:             Upper GI endoscopy Indications:           Gastro-esophageal reflux disease Providers:             Benay Pike. Alice Reichert MD, MD Referring MD:          Baxter Hire, MD (Referring MD) Medicines:             Propofol per Anesthesia Complications:         No immediate complications. Procedure:             Pre-Anesthesia Assessment:                        - The risks and benefits of the procedure and the                         sedation options and risks were discussed with the                         patient. All questions were answered and informed                         consent was obtained.                        - Patient identification and proposed procedure were                         verified prior to the procedure by the nurse. The                         procedure was verified in the procedure room.                        - ASA Grade Assessment: III - A patient with severe                         systemic disease.                        - After reviewing the risks and benefits, the patient                         was deemed in satisfactory condition to undergo the                         procedure.                        After obtaining informed consent, the endoscope was                         passed under direct vision. Throughout the procedure,  the patient's blood pressure, pulse, and oxygen                         saturations were monitored continuously. The Endoscope                         was introduced through the mouth, and advanced to the                         third part of duodenum. The upper GI endoscopy was                          accomplished without difficulty. The patient tolerated                         the procedure well. Findings:      The esophagus was normal.      A 1 cm hiatal hernia was present.      The examined duodenum was normal. Biopsies for histology were taken with       a cold forceps for evaluation of celiac disease. Impression:            - Normal esophagus.                        - 1 cm hiatal hernia.                        - Normal examined duodenum. Biopsied. Recommendation:        - Await pathology results.                        - Proceed with colonoscopy Procedure Code(s):     --- Professional ---                        [phone], Esophagogastroduodenoscopy, flexible,                         transoral; with biopsy, single or multiple Diagnosis Code(s):     --- Professional ---                        K21.9, Gastro-esophageal reflux disease without                         esophagitis                        K44.9, Diaphragmatic hernia without obstruction or                         gangrene CPT copyright 2019 American Medical Association. All rights reserved. The codes documented in this report are preliminary and upon coder review may  be revised to meet current compliance requirements. Efrain Sella MD, MD 10/02/2021 10:33:10 AM This report has been signed electronically. Number of Addenda: 0 Note Initiated On: 10/02/2021 10:12 AM Estimated Blood Loss:  Estimated blood loss: none.      [facility]

## 2021-10-02 NOTE — Transfer of Care (Signed)
Immediate Anesthesia Transfer of Care Note  Patient: Cassandra Gentry  Procedure(s) Performed: COLONOSCOPY WITH PROPOFOL ESOPHAGOGASTRODUODENOSCOPY (EGD)  Patient Location: PACU and Endoscopy Unit  Anesthesia Type:MAC  Level of Consciousness: drowsy  Airway & Oxygen Therapy: Patient Spontanous Breathing  Post-op Assessment: Report given to RN and Post -op Vital signs reviewed and stable  Post vital signs: Reviewed and stable  Last Vitals:  Vitals Value Taken Time  BP 129/52 10/02/21 1053  Temp 35.6 C 10/02/21 1052  Pulse 73 10/02/21 1054  Resp 15 10/02/21 1054  SpO2 100 % 10/02/21 1054  Vitals shown include unvalidated device data.  Last Pain:  Vitals:   10/02/21 1052  TempSrc: Temporal  PainSc: Asleep         Complications: No notable events documented.

## 2021-10-02 NOTE — Interval H&P Note (Signed)
History and Physical Interval Note:  10/02/2021 10:15 AM  Cassandra Gentry  has presented today for surgery, with the diagnosis of IDA.  The various methods of treatment have been discussed with the patient and family. After consideration of risks, benefits and other options for treatment, the patient has consented to  Procedure(s) with comments: COLONOSCOPY WITH PROPOFOL (N/A) - IDDM ESOPHAGOGASTRODUODENOSCOPY (EGD) (N/A) as a surgical intervention.  The patient's history has been reviewed, patient examined, no change in status, stable for surgery.  I have reviewed the patient's chart and labs.  Questions were answered to the patient's satisfaction.     [place], [place]

## 2021-10-02 NOTE — Op Note (Signed)
[facility] Gastroenterology Patient Name: Cassandra Gentry Procedure Date: 10/02/2021 10:11 AM MRN: [id number] Account #: [id number] Date of Birth: 11-May-1933 Admit Type: Outpatient Age: 88 Room: [facility] ENDO ROOM 2 Gender: Female Note Status: Finalized Instrument Name: Colonoscope [id number] Procedure:             Colonoscopy Indications:           Iron deficiency anemia secondary to chronic blood loss Providers:             Benay Pike. Alice Reichert MD, MD Referring MD:          Baxter Hire, MD (Referring MD) Medicines:             Propofol per Anesthesia Complications:         No immediate complications. Procedure:             Pre-Anesthesia Assessment:                        - The risks and benefits of the procedure and the                         sedation options and risks were discussed with the                         patient. All questions were answered and informed                         consent was obtained.                        - Patient identification and proposed procedure were                         verified prior to the procedure by the nurse. The                         procedure was verified in the procedure room.                        - ASA Grade Assessment: III - A patient with severe                         systemic disease.                        - After reviewing the risks and benefits, the patient                         was deemed in satisfactory condition to undergo the                         procedure.                        After obtaining informed consent, the colonoscope was                         passed under direct vision. Throughout the procedure,  the patient's blood pressure, pulse, and oxygen                         saturations were monitored continuously. The                         Colonoscope was introduced through the anus and                         advanced to the the cecum, identified by appendiceal                          orifice and ileocecal valve. The colonoscopy was                         technically difficult and complex due to multiple                         diverticula in the colon and restricted mobility of                         the colon. Successful completion of the procedure was                         aided by withdrawing and reinserting the scope. The                         patient tolerated the procedure well. The quality of                         the bowel preparation was good. The ileocecal valve,                         appendiceal orifice, and rectum were photographed. Findings:      The perianal and digital rectal examinations were normal. Pertinent       negatives include normal sphincter tone and no palpable rectal lesions.      Non-bleeding internal hemorrhoids were found during retroflexion. The       hemorrhoids were Grade I (internal hemorrhoids that do not prolapse).      Multiple small and large-mouthed diverticula were found in the sigmoid       colon. There was no evidence of diverticular bleeding.      A 4 mm polyp was found in the cecum. The polyp was sessile. The polyp       was removed with a jumbo cold forceps. Resection and retrieval were       complete.      The exam was otherwise without abnormality. Impression:            - Non-bleeding internal hemorrhoids.                        - Severe diverticulosis in the sigmoid colon. There                         was no evidence of diverticular bleeding.                        - One 4 mm  polyp in the cecum, removed with a jumbo                         cold forceps. Resected and retrieved.                        - The examination was otherwise normal. Recommendation:        - Await pathology results from EGD, also performed                         today.                        - Patient has a contact number available for                         emergencies. The signs and symptoms of potential                          delayed complications were discussed with the patient.                         Return to normal activities tomorrow. Written                         discharge instructions were provided to the patient.                        - High fiber diet.                        - Continue present medications.                        - Await pathology results.                        - If polyps are benign or adenomatous without                         dysplasia, I will advise NO further colonoscopy due to                         advanced age and/or severe comorbidity.                        - If duodenal biopsies negative for celiac disease,                         will proceed with obtaining a video capsule endoscopy                         of the small intestine.                        - Return to physician assistant in 2 months.                        - Follow up with Octavia Bruckner, PA-C in the GI office.                         [phone]                        - The findings and recommendations were discussed with                         the patient. Procedure Code(s):     --- Professional ---                        [phone], Colonoscopy, flexible; with biopsy, single or                         multiple Diagnosis Code(s):     --- Professional ---                        K57.30, Diverticulosis of large intestine without                         perforation or abscess without bleeding                        D50.0, Iron deficiency anemia secondary to blood loss                         (chronic)                        K63.5, Polyp of colon                        K64.0, First degree hemorrhoids CPT copyright 2019 [employer]. All rights reserved. The codes documented in this report are preliminary and upon coder review may  be revised to meet current compliance requirements. Efrain Sella MD, MD 10/02/2021 10:53:34 AM This report has been signed electronically. Number  of Addenda: 0 Note Initiated On: 10/02/2021 10:11 AM Scope Withdrawal Time: 0 hours 6 minutes 5 seconds  Total Procedure Duration: 0 hours 13 minutes 40 seconds  Estimated Blood Loss:  Estimated blood loss: none.      [facility]

## 2021-10-02 NOTE — Anesthesia Preprocedure Evaluation (Signed)
Anesthesia Evaluation  Patient identified by MRN, date of birth, ID band Patient awake    Reviewed: Allergy & Precautions, H&P , NPO status , Patient's Chart, lab work & pertinent test results, reviewed documented beta blocker date and time   History of Anesthesia Complications (+) history of anesthetic complications  Airway Mallampati: II   Neck ROM: full    Dental  (+) Poor Dentition   Pulmonary neg pulmonary ROS,    Pulmonary exam normal        Cardiovascular Exercise Tolerance: Good hypertension, On Medications + CAD  + dysrhythmias  Rhythm:regular Rate:Normal     Neuro/Psych negative neurological ROS  negative psych ROS   GI/Hepatic Neg liver ROS, GERD  Medicated,  Endo/Other  negative endocrine ROSdiabetes  Renal/GU negative Renal ROS  negative genitourinary   Musculoskeletal   Abdominal   Peds  Hematology  (+) Blood dyscrasia, anemia ,   Anesthesia Other Findings Past Medical History: No date: Chronic lymphocytic leukemia (HCC) No date: Complication of anesthesia     Comment:  SICK X 3 DAYS AFTER LAST CATARACT. LM FOR HER TO CALL               BACK WITH MORE INFO No date: Coronary artery disease No date: Diabetes mellitus without complication (HCC) No date: Diverticulosis No date: Dysrhythmia No date: Edema     Comment:  LEFT LEG No date: GERD (gastroesophageal reflux disease) No date: Hyperlipidemia No date: Hypertension No date: Thyroid disease Past Surgical History: 06/08/2018: CATARACT EXTRACTION W/PHACO; Left     Comment:  Procedure: CATARACT EXTRACTION PHACO AND INTRAOCULAR               LENS PLACEMENT (IOC);  Surgeon: Birder Robson, MD;                Location: [facility] ORS;  Service: Ophthalmology;  Laterality:              Left;  [place] 02:20 CDE 23.41 Fluid pack lot # [id number] 07/13/2018: CATARACT EXTRACTION W/PHACO; Right     Comment:  Procedure: CATARACT EXTRACTION PHACO AND  INTRAOCULAR               LENS PLACEMENT (IOC);  Surgeon: Birder Robson, MD;                Location: [facility] ORS;  Service: Ophthalmology;  Laterality:              Right;  [place] 01:18.2 CDE 15.42 Fluid Pack Lot # [id number] 2000: CORONARY ARTERY BYPASS GRAFT   Reproductive/Obstetrics negative OB ROS                             Anesthesia Physical Anesthesia Plan  ASA: 3  Anesthesia Plan: General   Post-op Pain Management:    Induction:   PONV Risk Score and Plan:   Airway Management Planned:   Additional Equipment:   Intra-op Plan:   Post-operative Plan:   Informed Consent: I have reviewed the patients History and Physical, chart, labs and discussed the procedure including the risks, benefits and alternatives for the proposed anesthesia with the patient or authorized representative who has indicated his/her understanding and acceptance.     Dental Advisory Given  Plan Discussed with: CRNA  Anesthesia Plan Comments:         Anesthesia Quick Evaluation

## 2021-10-02 NOTE — H&P (Signed)
Outpatient short stay form Pre-procedure 10/02/2021 10:13 AM Cassandra Gentry, M.D.  Primary Physician: Cassandra Gentry, M.D.  Reason for visit:  Iron deficiency anemia  History of present illness:  Cassandra Gentry presents to the [facility] GI department for chief complaint of iron-deficiency anemia at the request of her hematology - Dr. Tasia Gentry. Pt has history of CLL diagnosed in 2009, MGUS, and iron-deficiency anemia. She is referred here today for further work-up of IDA to rule out any chronic GI blood loss. Patient has received IV iron infusions and has received 5 iron infusions this year, most recently 12/21/20. Labs prior to infusions in February 2022 showed iron 22, TIBC 475, iron sat 5%, and ferritin 13 with hemoglobin 8.3. She denies any overt gastrointestinal blood loss in the form of hematochezia or melena. She has seen some bright red blood on tissue paper from time to time, about once a month. No rectal pain. No change in bowel habits. She is typically having a formed BM daily to every other day. She has had some generalized lower abdominal pain described as cramping and pressure. No real pattern with this discomfort. Dr. Tasia Gentry told her to start probiotic and she reports this has been helpful. Pain is sometimes better with passing flatus or having a BM. She denies any refractory GERD symptoms or breakthrough symptoms. She denies any nausea, vomiting, esophageal dysphagia, odynophagia, early satiety, or epigastric abdominal pain. Appetite and diet are stable. She denies any pica, chest pain, shortness of breath, or syncopal episodes. Last colonoscopy 04/2006 showed sigmoid diverticulosis and single medium angioectasia with no bleeding in cecum s/p APC.      Current Facility-Administered Medications:    0.9 %  sodium chloride infusion, , Intravenous, Continuous, Cassandra Gentry, Cassandra Pike, MD, Last Rate: 20 mL/hr at 10/02/21 0913, New Bag at 10/02/21 0913  Medications Prior to Admission  Medication Sig Dispense  Refill Last Dose   alendronate (FOSAMAX) 70 MG tablet Take 70 mg by mouth once a week. Take with a full glass of water on an empty stomach.   Past Week   Ascorbic Acid (VITAMIN C) 1000 MG tablet Take 1,000 mg by mouth daily after supper.    Past Week   chlorthalidone (HYGROTON) 25 MG tablet Take 12.5 mg by mouth every Monday, Wednesday, and Friday.    Past Week   cholecalciferol (VITAMIN D) 1000 units tablet Take 1,000 Units by mouth daily after supper.    Past Week   ezetimibe (ZETIA) 10 MG tablet Take 10 mg by mouth at bedtime.    Past Week   ferrous sulfate 325 (65 FE) MG EC tablet Take 1 tablet (325 mg total) by mouth daily. 90 tablet 1 Past Week   gabapentin (NEURONTIN) 100 MG capsule Take 100 mg by mouth daily.   10/01/2021   Glucosamine-Chondroitin (OSTEO BI-FLEX REGULAR STRENGTH PO) Take 1 tablet by mouth daily after supper.   Past Week   metoprolol succinate (TOPROL-XL) 50 MG 24 hr tablet Take 25 mg by mouth 2 (two) times daily.    10/01/2021   Multiple Vitamin (MULTIVITAMIN WITH MINERALS) TABS tablet Take 1 tablet by mouth daily. Multivitamin for Women 50+   Past Week   Omega-3 Fatty Acids (FISH OIL) 1200 MG CAPS Take 1,200 mg by mouth every evening.   10/01/2021   acetaminophen (TYLENOL) 500 MG tablet Take 1,000 mg by mouth every 6 (six) hours as needed for moderate pain or headache.       aspirin 325 MG tablet Take 325  mg by mouth every evening.    09/30/2021   fluocinonide cream (LIDEX) 3.29 % Apply 1 application topically 3 (three) times daily as needed (for itching).       insulin aspart (NOVOLOG) 100 UNIT/ML injection Inject into the skin daily. Via insulin pump      Insulin Human (INSULIN PUMP) SOLN Inject into the skin. insulin aspart (NOVOLOG) 100 unit/ml      losartan (COZAAR) 50 MG tablet Take 50 mg by mouth daily.       mometasone (ELOCON) 0.1 % lotion Apply 4 drops topically 2 (two) times a week. Into ears      NATURAL PSYLLIUM SEED PO Take 2 capsules by mouth daily at 3 pm.        pantoprazole (PROTONIX) 40 MG tablet Take 40 mg by mouth daily before breakfast.       Probiotic Product (PROBIOTIC DAILY PO) Take 1 capsule by mouth daily after supper.        Allergies  Allergen Reactions   Amoxicillin-Pot Clavulanate Nausea Only and Other (See Comments)    Has patient had a PCN reaction causing immediate rash, facial/tongue/throat swelling, SOB or lightheadedness with hypotension: No Has patient had a PCN reaction causing severe rash involving mucus membranes or skin necrosis: No Has patient had a PCN reaction that required hospitalization: No Has patient had a PCN reaction occurring within the last 10 years: No If all of the above answers are "NO", then may proceed with Cephalosporin use.    Other Nausea Only and Other (See Comments)    Contrast dye   Shellfish Allergy Nausea Only   Simvastatin Other (See Comments)    Joint pain     Past Medical History:  Diagnosis Date   Chronic lymphocytic leukemia (HCC)    Complication of anesthesia    SICK X 3 DAYS AFTER LAST CATARACT. LM FOR HER TO CALL BACK WITH MORE INFO   Coronary artery disease    Diabetes mellitus without complication (HCC)    Diverticulosis    Dysrhythmia    Edema    LEFT LEG   GERD (gastroesophageal reflux disease)    Hyperlipidemia    Hypertension    Thyroid disease     Review of systems:  Otherwise negative.    Physical Exam  Gen: Alert, oriented. Appears stated age.  HEENT: [place]/AT. PERRLA. Lungs: CTA, no wheezes. CV: RR nl S1, S2. Abd: soft, benign, no masses. BS+ Ext: No edema. Pulses 2+    Planned procedures: Proceed with EGD and colonoscopy. The patient understands the nature of the planned procedure, indications, risks, alternatives and potential complications including but not limited to bleeding, infection, perforation, damage to internal organs and possible oversedation/side effects from anesthesia. The patient agrees and gives consent to proceed.  Please refer to  procedure notes for findings, recommendations and patient disposition/instructions.     Cassandra Gentry K. Cassandra Gentry, M.D. Gastroenterology 10/02/2021  10:13 AM

## 2021-10-03 LAB — SURGICAL PATHOLOGY

## 2021-10-03 NOTE — Anesthesia Postprocedure Evaluation (Signed)
Anesthesia Post Note  Patient: Cassandra Gentry  Procedure(s) Performed: COLONOSCOPY WITH PROPOFOL ESOPHAGOGASTRODUODENOSCOPY (EGD)  Patient location during evaluation: PACU Anesthesia Type: General Level of consciousness: awake and alert Pain management: pain level controlled Vital Signs Assessment: post-procedure vital signs reviewed and stable Respiratory status: spontaneous breathing, nonlabored ventilation, respiratory function stable and patient connected to nasal cannula oxygen Cardiovascular status: blood pressure returned to baseline and stable Postop Assessment: no apparent nausea or vomiting Anesthetic complications: no   No notable events documented.   Last Vitals:  Vitals:   10/02/21 1112 10/02/21 1113  BP:  (!) 147/89  Pulse: 63 (!) 59  Resp: 15 15  Temp:    SpO2: 99% 95%    Last Pain:  Vitals:   10/02/21 1112  TempSrc:   PainSc: 0-No pain                 Molli Barrows

## 2021-10-09 ENCOUNTER — Inpatient Hospital Stay: Payer: Medicare HMO

## 2021-10-11 ENCOUNTER — Inpatient Hospital Stay: Payer: Medicare HMO | Attending: Oncology

## 2021-10-11 ENCOUNTER — Other Ambulatory Visit: Payer: Self-pay

## 2021-10-11 DIAGNOSIS — D509 Iron deficiency anemia, unspecified: Secondary | ICD-10-CM | POA: Diagnosis not present

## 2021-10-11 DIAGNOSIS — I10 Essential (primary) hypertension: Secondary | ICD-10-CM | POA: Diagnosis not present

## 2021-10-11 DIAGNOSIS — E1036 Type 1 diabetes mellitus with diabetic cataract: Secondary | ICD-10-CM | POA: Insufficient documentation

## 2021-10-11 DIAGNOSIS — Z79899 Other long term (current) drug therapy: Secondary | ICD-10-CM | POA: Diagnosis not present

## 2021-10-11 DIAGNOSIS — E871 Hypo-osmolality and hyponatremia: Secondary | ICD-10-CM | POA: Insufficient documentation

## 2021-10-11 DIAGNOSIS — C9110 Chronic lymphocytic leukemia of B-cell type not having achieved remission: Secondary | ICD-10-CM | POA: Diagnosis not present

## 2021-10-11 DIAGNOSIS — I2510 Atherosclerotic heart disease of native coronary artery without angina pectoris: Secondary | ICD-10-CM | POA: Diagnosis not present

## 2021-10-11 DIAGNOSIS — K219 Gastro-esophageal reflux disease without esophagitis: Secondary | ICD-10-CM | POA: Diagnosis not present

## 2021-10-11 DIAGNOSIS — Z7982 Long term (current) use of aspirin: Secondary | ICD-10-CM | POA: Insufficient documentation

## 2021-10-11 DIAGNOSIS — E079 Disorder of thyroid, unspecified: Secondary | ICD-10-CM | POA: Insufficient documentation

## 2021-10-11 DIAGNOSIS — E785 Hyperlipidemia, unspecified: Secondary | ICD-10-CM | POA: Diagnosis not present

## 2021-10-11 DIAGNOSIS — Z794 Long term (current) use of insulin: Secondary | ICD-10-CM | POA: Insufficient documentation

## 2021-10-11 DIAGNOSIS — D472 Monoclonal gammopathy: Secondary | ICD-10-CM | POA: Diagnosis not present

## 2021-10-11 LAB — CBC WITH DIFFERENTIAL/PLATELET
Abs Immature Granulocytes: 0 10*3/uL (ref 0.00–0.07)
Band Neutrophils: 0 %
Basophils Absolute: 0.2 10*3/uL — ABNORMAL HIGH (ref 0.0–0.1)
Basophils Relative: 1 %
Blasts: 0 %
Eosinophils Absolute: 0.3 10*3/uL (ref 0.0–0.5)
Eosinophils Relative: 2 %
HCT: 32.1 % — ABNORMAL LOW (ref 36.0–46.0)
Hemoglobin: 11.1 g/dL — ABNORMAL LOW (ref 12.0–15.0)
Lymphocytes Relative: 71 %
Lymphs Abs: 11.9 10*3/uL — ABNORMAL HIGH (ref 0.7–4.0)
MCH: 30.2 pg (ref 26.0–34.0)
MCHC: 34.6 g/dL (ref 30.0–36.0)
MCV: 87.5 fL (ref 80.0–100.0)
Metamyelocytes Relative: 0 %
Monocytes Absolute: 0.7 10*3/uL (ref 0.1–1.0)
Monocytes Relative: 4 %
Myelocytes: 0 %
Neutro Abs: 3.7 10*3/uL (ref 1.7–7.7)
Neutrophils Relative %: 22 %
Other: 0 %
Platelets: 272 10*3/uL (ref 150–400)
Promyelocytes Relative: 0 %
RBC: 3.67 MIL/uL — ABNORMAL LOW (ref 3.87–5.11)
RDW: 13.6 % (ref 11.5–15.5)
Smear Review: ADEQUATE
WBC: 16.8 10*3/uL — ABNORMAL HIGH (ref 4.0–10.5)
nRBC: 0 % (ref 0.0–0.2)
nRBC: 0 /100 WBC

## 2021-10-11 LAB — COMPREHENSIVE METABOLIC PANEL
ALT: 23 U/L (ref 0–44)
AST: 23 U/L (ref 15–41)
Albumin: 4.2 g/dL (ref 3.5–5.0)
Alkaline Phosphatase: 66 U/L (ref 38–126)
Anion gap: 9 (ref 5–15)
BUN: 17 mg/dL (ref 8–23)
CO2: 28 mmol/L (ref 22–32)
Calcium: 9.3 mg/dL (ref 8.9–10.3)
Chloride: 92 mmol/L — ABNORMAL LOW (ref 98–111)
Creatinine, Ser: 0.62 mg/dL (ref 0.44–1.00)
GFR, Estimated: >60 mL/min (ref >60)
Glucose, Bld: 131 mg/dL — ABNORMAL HIGH (ref 70–99)
Potassium: 4.4 mmol/L (ref 3.5–5.1)
Sodium: 129 mmol/L — ABNORMAL LOW (ref 135–145)
Total Bilirubin: 0.9 mg/dL (ref 0.3–1.2)
Total Protein: 6.5 g/dL (ref 6.5–8.1)

## 2021-10-14 LAB — KAPPA/LAMBDA LIGHT CHAINS
Kappa free light chain: 66.7 mg/L — ABNORMAL HIGH (ref 3.3–19.4)
Kappa, lambda light chain ratio: 7.17 — ABNORMAL HIGH (ref 0.26–1.65)
Lambda free light chains: 9.3 mg/L (ref 5.7–26.3)

## 2021-10-15 LAB — MULTIPLE MYELOMA PANEL, SERUM
Albumin SerPl Elph-Mcnc: 4 g/dL (ref 2.9–4.4)
Albumin/Glob SerPl: 1.9 — ABNORMAL HIGH (ref 0.7–1.7)
Alpha 1: 0.3 g/dL (ref 0.0–0.4)
Alpha2 Glob SerPl Elph-Mcnc: 0.8 g/dL (ref 0.4–1.0)
B-Globulin SerPl Elph-Mcnc: 0.8 g/dL (ref 0.7–1.3)
Gamma Glob SerPl Elph-Mcnc: 0.3 g/dL — ABNORMAL LOW (ref 0.4–1.8)
Globulin, Total: 2.2 g/dL (ref 2.2–3.9)
IgA: 31 mg/dL — ABNORMAL LOW (ref 64–422)
IgG (Immunoglobin G), Serum: 359 mg/dL — ABNORMAL LOW (ref 586–1602)
IgM (Immunoglobulin M), Srm: 245 mg/dL — ABNORMAL HIGH (ref 26–217)
M Protein SerPl Elph-Mcnc: 0.1 g/dL — ABNORMAL HIGH
Total Protein ELP: 6.2 g/dL (ref 6.0–8.5)

## 2021-10-16 ENCOUNTER — Inpatient Hospital Stay: Payer: Medicare HMO | Admitting: Oncology

## 2021-10-21 ENCOUNTER — Encounter: Payer: Self-pay | Admitting: Oncology

## 2021-10-21 ENCOUNTER — Other Ambulatory Visit: Payer: Self-pay

## 2021-10-21 ENCOUNTER — Inpatient Hospital Stay: Payer: Medicare HMO | Admitting: Oncology

## 2021-10-21 VITALS — BP 174/75 | HR 64 | Temp 97.2°F | Wt 151.9 lb

## 2021-10-21 DIAGNOSIS — D472 Monoclonal gammopathy: Secondary | ICD-10-CM

## 2021-10-21 DIAGNOSIS — C9110 Chronic lymphocytic leukemia of B-cell type not having achieved remission: Secondary | ICD-10-CM | POA: Diagnosis not present

## 2021-10-21 DIAGNOSIS — E871 Hypo-osmolality and hyponatremia: Secondary | ICD-10-CM

## 2021-10-21 DIAGNOSIS — D509 Iron deficiency anemia, unspecified: Secondary | ICD-10-CM

## 2021-10-21 NOTE — Progress Notes (Signed)
Hematology/Oncology Progress  note [phone] [phone]   Patient Care Team: Baxter Hire, MD as PCP - General (Internal Medicine)  REFERRING PROVIDER: Baxter Hire, MD  CHIEF COMPLAINTS/REASON FOR VISIT:  Follow up for CLL and anemia   HISTORY OF PRESENTING ILLNESS:   Cassandra Gentry is a  88 y.o.  female with PMH listed below was seen in consultation at the request of  Baxter Hire, MD  for evaluation of CLL and anemia  Reports a diagnosis of CLL and she previously follows up with [facility] oncology.  She participated in a clinical trial.  Patient lost to follow-up since the start of COVID-19 pandemic. Patient denies ever being on any treatment for CLL.  Denies any constitutional symptoms 05/04/2020, CBC showed hemoglobin 8.6, hematocrit 27, elevated white count 22.8, MCV 75.6, Patient was referred to hematology to establish care . She follows up with endocrinology for type 1 diabetes and was seen by Dr. Honor Junes on 09/12/2020.  Denies any change of bowel habit, bloody or black stool.  She reports intermittent rectal bleeding which she attributes to hemorrhoid bleeding.  # I obtained and reviewed oncology medical records at [facility]. Last seen by Dr. Dorothea Ogle on 12/01/2017. a. Incidentally noted to have a mild leukocytosis on a routine annual examination, 11/09.  b. Initial hematologic evaluation 07/22/08 shows WBC 13.5, HGB 12.5, PLT 214.  Peripheral blood flow cytometry shows CD5, CD19, CD23, kappa restricted monoclonal B-cell population comprising 38% of peripheral blood events, CD38 negative, consistent with a diagnosis of chronic lymphocytic leukemia.   c. 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   INTERVAL HISTORY Cassandra Gentry is a 88 y.o. female who has  above history reviewed by me today presents for follow up visit for CLL, iron deficiency anemia, MGUS Patient reports feeling well.  Patient takes oral iron supplementation.  She has no new complaints.   :Review of Systems  Constitutional:  Positive for fatigue. Negative for appetite change, chills and fever.  HENT:   Negative for hearing loss and voice change.   Eyes:  Negative for eye problems.  Respiratory:  Negative for chest tightness and cough.   Cardiovascular:  Negative for chest pain.  Gastrointestinal:  Negative for abdominal distention, abdominal pain and blood in stool.  Endocrine: Negative for hot flashes.  Genitourinary:  Negative for difficulty urinating and frequency.   Musculoskeletal:  Negative for arthralgias.  Skin:  Negative for itching and rash.  Neurological:  Negative for extremity weakness.  Hematological:  Negative for adenopathy.  Psychiatric/Behavioral:  Negative for confusion.    MEDICAL HISTORY:  Past Medical History:  Diagnosis Date   Chronic lymphocytic leukemia ([place])    Complication of anesthesia    SICK X 3 DAYS AFTER LAST CATARACT. LM FOR HER TO CALL BACK WITH MORE INFO   Coronary artery disease    Diabetes mellitus without complication (HCC)    Diverticulosis    Dysrhythmia    Edema    LEFT LEG   GERD (gastroesophageal reflux disease)    Hyperlipidemia    Hypertension    Thyroid disease     SURGICAL HISTORY: Past Surgical History:  Procedure Laterality Date   CATARACT EXTRACTION W/PHACO Left 06/08/2018   Procedure: CATARACT EXTRACTION PHACO AND INTRAOCULAR LENS PLACEMENT ([place]);  Surgeon: Birder Robson, MD;  Location: [facility] ORS;  Service: Ophthalmology;  Laterality: Left;  [place] 02:20 CDE 23.41 Fluid pack lot # [id number]   CATARACT EXTRACTION W/PHACO Right 07/13/2018   Procedure: CATARACT EXTRACTION PHACO AND INTRAOCULAR LENS PLACEMENT ([facility]);  Surgeon: Birder Robson, MD;  Location: [facility] ORS;  Service: Ophthalmology;  Laterality:  Right;  [place] 01:18.2 CDE 15.42 Fluid Pack Lot # [id number]   COLONOSCOPY WITH PROPOFOL N/A 10/02/2021   Procedure: COLONOSCOPY WITH PROPOFOL;  Surgeon: Toledo, Benay Pike, MD;  Location: [facility] ENDOSCOPY;  Service: Gastroenterology;  Laterality: N/A;  IDDM   CORONARY ARTERY BYPASS GRAFT  2000   ESOPHAGOGASTRODUODENOSCOPY N/A 10/02/2021   Procedure: ESOPHAGOGASTRODUODENOSCOPY (EGD);  Surgeon: Toledo, Benay Pike, MD;  Location: [facility] ENDOSCOPY;  Service: Gastroenterology;  Laterality: N/A;    SOCIAL HISTORY: Social History   Socioeconomic History   Marital status: Married    Spouse name: Not on file   Number of children: Not on file   Years of education: Not on file   Highest education level: Not on file  Occupational History   Not on file  Tobacco Use   Smoking status: Never   Smokeless tobacco: Never  Vaping Use   Vaping Use: Never used  Substance and Sexual Activity   Alcohol use: Never   Drug use: Never   Sexual activity: Not on file  Other Topics Concern   Not on file  Social History Narrative   Not on file   Social Determinants of Health   Financial Resource Strain: Not on file  Food Insecurity: Not on file  Transportation Needs: Not on file  Physical Activity: Not on file  Stress: Not on file  Social Connections: Not on file  Intimate Partner Violence: Not on file    FAMILY HISTORY: Family History  Problem Relation Age of Onset   Parkinson's disease Mother    Heart disease Father    Stomach cancer Maternal Uncle     ALLERGIES:  is allergic to amoxicillin-pot clavulanate, other, shellfish allergy, and simvastatin.  MEDICATIONS:  Current Outpatient Medications  Medication Sig Dispense Refill   acetaminophen (TYLENOL) 500 MG tablet Take 1,000 mg by mouth every 6 (six) hours as needed for moderate pain or headache.      alendronate (FOSAMAX) 70 MG tablet Take 70 mg by mouth once a week. Take with a full glass of water on an empty stomach.     Ascorbic Acid (VITAMIN C)  1000 MG tablet Take 1,000 mg by mouth daily after supper.      aspirin 325 MG tablet Take 325 mg by mouth every evening.      chlorthalidone (HYGROTON) 25 MG tablet Take 12.5 mg by mouth every Monday, Wednesday, and Friday.      cholecalciferol (VITAMIN D) 1000 units tablet Take 1,000 Units by mouth daily after supper.      ezetimibe (ZETIA) 10 MG tablet Take 10 mg by mouth at bedtime.      ferrous sulfate 325 (65 FE) MG EC tablet Take 1 tablet (325 mg total) by mouth daily. 90 tablet 1   fluocinonide cream (LIDEX) 8.65 % Apply 1 application topically 3 (three) times daily as needed (for itching).      gabapentin (NEURONTIN) 100 MG capsule Take 100 mg by mouth daily.     Glucosamine-Chondroitin (OSTEO BI-FLEX REGULAR STRENGTH PO) Take 1 tablet by mouth daily after supper.     insulin aspart (NOVOLOG) 100 UNIT/ML injection Inject into the skin daily. Via insulin pump     Insulin Human (INSULIN PUMP) SOLN Inject  into the skin. insulin aspart (NOVOLOG) 100 unit/ml     losartan (COZAAR) 50 MG tablet Take 50 mg by mouth daily.      metoprolol succinate (TOPROL-XL) 50 MG 24 hr tablet Take 25 mg by mouth 2 (two) times daily.      mometasone (ELOCON) 0.1 % lotion Apply 4 drops topically 2 (two) times a week. Into ears     Multiple Vitamin (MULTIVITAMIN WITH MINERALS) TABS tablet Take 1 tablet by mouth daily. Multivitamin for Women 50+     NATURAL PSYLLIUM SEED PO Take 2 capsules by mouth daily at 3 pm.      Omega-3 Fatty Acids (FISH OIL) 1200 MG CAPS Take 1,200 mg by mouth every evening.     pantoprazole (PROTONIX) 40 MG tablet Take 40 mg by mouth daily before breakfast.      Probiotic Product (PROBIOTIC DAILY PO) Take 1 capsule by mouth daily after supper.     No current facility-administered medications for this visit.     PHYSICAL EXAMINATION: ECOG PERFORMANCE STATUS: 1 - Symptomatic but completely ambulatory Vitals:   10/21/21 0952  BP: (!) 174/75  Pulse: 64  Temp: (!) 97.2 F (36.2 C)    Filed Weights   10/21/21 0952  Weight: 151 lb 14.4 oz (68.9 kg)    Physical Exam Constitutional:      General: She is not in acute distress. HENT:     Head: Normocephalic and atraumatic.  Eyes:     General: No scleral icterus. Cardiovascular:     Rate and Rhythm: Normal rate and regular rhythm.     Heart sounds: Normal heart sounds.     Comments: Occasional PVCs Pulmonary:     Effort: Pulmonary effort is normal. No respiratory distress.     Breath sounds: No wheezing.  Abdominal:     General: Bowel sounds are normal. There is no distension.     Palpations: Abdomen is soft.  Musculoskeletal:        General: No deformity. Normal range of motion.     Cervical back: Normal range of motion and neck supple.  Skin:    General: Skin is warm and dry.     Findings: No erythema or rash.  Neurological:     Mental Status: She is alert and oriented to person, place, and time. Mental status is at baseline.     Cranial Nerves: No cranial nerve deficit.     Coordination: Coordination normal.  Psychiatric:        Mood and Affect: Mood normal.    LABORATORY DATA:  I have reviewed the data as listed Lab Results  Component Value Date   WBC 16.8 (H) 10/11/2021   HGB 11.1 (L) 10/11/2021   HCT 32.1 (L) 10/11/2021   MCV 87.5 10/11/2021   PLT 272 10/11/2021   Recent Labs    04/12/21 1124 10/11/21 1056  NA 132* 129*  K 4.7 4.4  CL 98 92*  CO2 25 28  GLUCOSE 144* 131*  BUN 21 17  CREATININE 0.69 0.62  CALCIUM 9.1 9.3  GFRNONAA >60 >60  PROT 6.2* 6.5  ALBUMIN 4.0 4.2  AST 27 23  ALT 21 23  ALKPHOS 53 66  BILITOT 0.9 0.9    Iron/TIBC/Ferritin/ %Sat    Component Value Date/Time   IRON 68 07/15/2021 1154   TIBC 343 07/15/2021 1154   FERRITIN 41 07/15/2021 1154   IRONPCTSAT 20 07/15/2021 1154      RADIOGRAPHIC STUDIES: I have personally reviewed the radiological  images as listed and agreed with the findings in the report. No results found.    ASSESSMENT & PLAN:   1. CLL (chronic lymphocytic leukemia) ([place])   2. MGUS (monoclonal gammopathy of unknown significance)   3. Iron deficiency anemia, unspecified iron deficiency anemia type   4. 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  No consitutional symptoms, continue watchful waiting.    IGVH test was not successfl due to low B cell clonal band.   Labs reviewed and discussed with patient Stable total white count.  No B symptoms.  Continue watchful waiting  #Iron deficiency anemia, hemoglobin has improved to 11.1. Continue oral iron supplementation.  I will hold off IV Venofer. 10/02/2021, EGD and colonoscopy was performed.  Patient has a nonmalignant polyp which was removed.   #IgM MGUS, Multiple myeloma panel showed IgM monoclonal protein with kappa light chain specificity.  M protein 0.1, Light chain ratio has decreased. UPEP is pending. Continue to monitor.  #Hyponatremia, sodium level is 129.  Chronic.  This is likely secondary to chlorthalidone.  Orders Placed This Encounter  Procedures   CBC with Differential/Platelet    Standing Status:   Future    Standing Expiration Date:   10/21/2022   Comprehensive metabolic panel    Standing Status:   Future    Standing Expiration Date:   10/21/2022   Iron and TIBC    Standing Status:   Future    Standing Expiration Date:   10/21/2022   Ferritin    Standing Status:   Future    Standing Expiration Date:   10/21/2022   Multiple Myeloma Panel (SPEP&IFE w/QIG)    Standing Status:   Future    Standing Expiration Date:   10/21/2022   Kappa/lambda light chains    Standing Status:   Future    Standing Expiration Date:   10/21/2022    All questions were answered. The patient knows to call the clinic with any problems questions or concerns.  cc Baxter Hire, MD    Return of visit: 6 months.  Earlie Server, MD, PhD 10/21/2021

## 2021-10-30 LAB — IFE+PROTEIN ELECTRO, 24-HR UR
Total Protein, Urine-Ur/day: 152 mg/(24.h) — ABNORMAL HIGH (ref 30–150)
Total Protein, Urine: 12.7 mg/dL
Total Volume: 1200

## 2022-04-21 ENCOUNTER — Inpatient Hospital Stay: Payer: Medicare HMO | Attending: Oncology

## 2022-04-21 DIAGNOSIS — E1036 Type 1 diabetes mellitus with diabetic cataract: Secondary | ICD-10-CM | POA: Diagnosis not present

## 2022-04-21 DIAGNOSIS — D472 Monoclonal gammopathy: Secondary | ICD-10-CM | POA: Diagnosis not present

## 2022-04-21 DIAGNOSIS — Z800 Family history of malignant neoplasm of digestive organs: Secondary | ICD-10-CM | POA: Insufficient documentation

## 2022-04-21 DIAGNOSIS — I10 Essential (primary) hypertension: Secondary | ICD-10-CM | POA: Diagnosis not present

## 2022-04-21 DIAGNOSIS — D509 Iron deficiency anemia, unspecified: Secondary | ICD-10-CM | POA: Diagnosis not present

## 2022-04-21 DIAGNOSIS — C9110 Chronic lymphocytic leukemia of B-cell type not having achieved remission: Secondary | ICD-10-CM | POA: Insufficient documentation

## 2022-04-21 LAB — CBC WITH DIFFERENTIAL/PLATELET
Abs Immature Granulocytes: 0.09 10*3/uL — ABNORMAL HIGH (ref 0.00–0.07)
Basophils Absolute: 0.1 10*3/uL (ref 0.0–0.1)
Basophils Relative: 1 %
Eosinophils Absolute: 0.4 10*3/uL (ref 0.0–0.5)
Eosinophils Relative: 3 %
HCT: 31.5 % — ABNORMAL LOW (ref 36.0–46.0)
Hemoglobin: 10.7 g/dL — ABNORMAL LOW (ref 12.0–15.0)
Immature Granulocytes: 1 %
Lymphocytes Relative: 69 %
Lymphs Abs: 10.2 10*3/uL — ABNORMAL HIGH (ref 0.7–4.0)
MCH: 29.2 pg (ref 26.0–34.0)
MCHC: 34 g/dL (ref 30.0–36.0)
MCV: 86.1 fL (ref 80.0–100.0)
Monocytes Absolute: 0.7 10*3/uL (ref 0.1–1.0)
Monocytes Relative: 4 %
Neutro Abs: 3.3 10*3/uL (ref 1.7–7.7)
Neutrophils Relative %: 22 %
Platelets: 249 10*3/uL (ref 150–400)
RBC: 3.66 MIL/uL — ABNORMAL LOW (ref 3.87–5.11)
RDW: 14 % (ref 11.5–15.5)
Smear Review: NORMAL
WBC: 14.8 10*3/uL — ABNORMAL HIGH (ref 4.0–10.5)
nRBC: 0 % (ref 0.0–0.2)

## 2022-04-21 LAB — COMPREHENSIVE METABOLIC PANEL
ALT: 21 U/L (ref 0–44)
AST: 26 U/L (ref 15–41)
Albumin: 3.8 g/dL (ref 3.5–5.0)
Alkaline Phosphatase: 65 U/L (ref 38–126)
Anion gap: 6 (ref 5–15)
BUN: 14 mg/dL (ref 8–23)
CO2: 27 mmol/L (ref 22–32)
Calcium: 8.8 mg/dL — ABNORMAL LOW (ref 8.9–10.3)
Chloride: 99 mmol/L (ref 98–111)
Creatinine, Ser: 0.71 mg/dL (ref 0.44–1.00)
GFR, Estimated: >60 mL/min (ref >60)
Glucose, Bld: 102 mg/dL — ABNORMAL HIGH (ref 70–99)
Potassium: 4.5 mmol/L (ref 3.5–5.1)
Sodium: 132 mmol/L — ABNORMAL LOW (ref 135–145)
Total Bilirubin: 1.2 mg/dL (ref 0.3–1.2)
Total Protein: 6.2 g/dL — ABNORMAL LOW (ref 6.5–8.1)

## 2022-04-21 LAB — IRON AND TIBC
Iron: 96 ug/dL (ref 28–170)
Saturation Ratios: 27 % (ref 10.4–31.8)
TIBC: 363 ug/dL (ref 250–450)
UIBC: 267 ug/dL

## 2022-04-21 LAB — FERRITIN: Ferritin: 31 ng/mL (ref 11–307)

## 2022-04-22 LAB — KAPPA/LAMBDA LIGHT CHAINS
Kappa free light chain: 53.1 mg/L — ABNORMAL HIGH (ref 3.3–19.4)
Kappa, lambda light chain ratio: 7.59 — ABNORMAL HIGH (ref 0.26–1.65)
Lambda free light chains: 7 mg/L (ref 5.7–26.3)

## 2022-04-28 ENCOUNTER — Encounter: Payer: Self-pay | Admitting: Oncology

## 2022-04-28 ENCOUNTER — Inpatient Hospital Stay: Payer: Medicare HMO | Admitting: Oncology

## 2022-04-28 VITALS — BP 138/47 | HR 52 | Temp 97.6°F | Resp 18 | Wt 156.4 lb

## 2022-04-28 DIAGNOSIS — C9110 Chronic lymphocytic leukemia of B-cell type not having achieved remission: Secondary | ICD-10-CM

## 2022-04-28 DIAGNOSIS — D472 Monoclonal gammopathy: Secondary | ICD-10-CM | POA: Diagnosis not present

## 2022-04-28 DIAGNOSIS — D509 Iron deficiency anemia, unspecified: Secondary | ICD-10-CM

## 2022-04-28 LAB — MULTIPLE MYELOMA PANEL, SERUM
Albumin SerPl Elph-Mcnc: 3.7 g/dL (ref 2.9–4.4)
Albumin/Glob SerPl: 2 — ABNORMAL HIGH (ref 0.7–1.7)
Alpha 1: 0.2 g/dL (ref 0.0–0.4)
Alpha2 Glob SerPl Elph-Mcnc: 0.7 g/dL (ref 0.4–1.0)
B-Globulin SerPl Elph-Mcnc: 0.6 g/dL — ABNORMAL LOW (ref 0.7–1.3)
Gamma Glob SerPl Elph-Mcnc: 0.4 g/dL (ref 0.4–1.8)
Globulin, Total: 1.9 g/dL — ABNORMAL LOW (ref 2.2–3.9)
IgA: 24 mg/dL — ABNORMAL LOW (ref 64–422)
IgG (Immunoglobin G), Serum: 357 mg/dL — ABNORMAL LOW (ref 586–1602)
IgM (Immunoglobulin M), Srm: 161 mg/dL (ref 26–217)
Total Protein ELP: 5.6 g/dL — ABNORMAL LOW (ref 6.0–8.5)

## 2022-04-28 NOTE — Progress Notes (Signed)
Hematology/Oncology Progress  note [phone] [phone]   Patient Care Team: Baxter Hire, MD as PCP - General (Internal Medicine)  REFERRING PROVIDER: Baxter Hire, MD  CHIEF COMPLAINTS/REASON FOR VISIT:  Follow up for CLL and anemia   HISTORY OF PRESENTING ILLNESS:   Cassandra Gentry is a  88 y.o.  female with PMH listed below was seen in consultation at the request of  Baxter Hire, MD  for evaluation of CLL and anemia  Reports a diagnosis of CLL and she previously follows up with [facility] oncology.  She participated in a clinical trial.  Patient lost to follow-up since the start of COVID-19 pandemic. Patient denies ever being on any treatment for CLL.  Denies any constitutional symptoms 05/04/2020, CBC showed hemoglobin 8.6, hematocrit 27, elevated white count 22.8, MCV 75.6, Patient was referred to hematology to establish care . She follows up with endocrinology for type 1 diabetes and was seen by Dr. Honor Junes on 09/12/2020.  Denies any change of bowel habit, bloody or black stool.  She reports intermittent rectal bleeding which she attributes to hemorrhoid bleeding.  # I obtained and reviewed oncology medical records at [facility]. Last seen by Dr. Dorothea Ogle on 12/01/2017. a. Incidentally noted to have a mild leukocytosis on a routine annual examination, 11/09.  b. Initial hematologic evaluation 07/22/08 shows WBC 13.5, HGB 12.5, PLT 214.  Peripheral blood flow cytometry shows CD5, CD19, CD23, kappa restricted monoclonal B-cell population comprising 38% of peripheral blood events, CD38 negative, consistent with a diagnosis of chronic lymphocytic leukemia.   c. Initial evaluation at 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   INTERVAL HISTORY Cassandra Gentry is a 88 y.o. female who has  above history reviewed by me today presents for follow up visit for CLL, iron deficiency anemia, MGUS Patient takes oral iron supplementation.  She has no new complaints. Chronic fatigue, unchanged.  :Review of Systems  Constitutional:  Positive for fatigue. Negative for appetite change, chills and fever.  HENT:   Negative for hearing loss and voice change.   Eyes:  Negative for eye problems.  Respiratory:  Negative for chest tightness and cough.   Cardiovascular:  Negative for chest pain.  Gastrointestinal:  Negative for abdominal distention, abdominal pain and blood in stool.  Endocrine: Negative for hot flashes.  Genitourinary:  Negative for difficulty urinating and frequency.   Musculoskeletal:  Negative for arthralgias.  Skin:  Negative for itching and rash.  Neurological:  Negative for extremity weakness.  Hematological:  Negative for adenopathy.  Psychiatric/Behavioral:  Negative for confusion.     MEDICAL HISTORY:  Past Medical History:  Diagnosis Date   Chronic lymphocytic leukemia ([place])    Complication of anesthesia    SICK X 3 DAYS AFTER LAST CATARACT. LM FOR HER TO CALL BACK WITH MORE INFO   Coronary artery disease    Diabetes mellitus without complication (HCC)    Diverticulosis    Dysrhythmia    Edema    LEFT LEG   GERD (gastroesophageal reflux disease)    Hyperlipidemia    Hypertension    Thyroid disease     SURGICAL HISTORY: Past Surgical History:  Procedure Laterality Date   CATARACT EXTRACTION W/PHACO Left 06/08/2018   Procedure: CATARACT EXTRACTION PHACO AND INTRAOCULAR LENS PLACEMENT ([place]);  Surgeon: Birder Robson, MD;  Location: [facility] ORS;  Service: Ophthalmology;  Laterality: Left;  [place] 02:20 CDE 23.41 Fluid pack lot # [id number]   CATARACT EXTRACTION W/PHACO Right 07/13/2018   Procedure: CATARACT EXTRACTION PHACO AND INTRAOCULAR LENS PLACEMENT ([facility]);  Surgeon: Birder Robson, MD;  Location: [facility] ORS;  Service: Ophthalmology;  Laterality: Right;   [place] 01:18.2 CDE 15.42 Fluid Pack Lot # [id number]   COLONOSCOPY WITH PROPOFOL N/A 10/02/2021   Procedure: COLONOSCOPY WITH PROPOFOL;  Surgeon: Toledo, Benay Pike, MD;  Location: [facility] ENDOSCOPY;  Service: Gastroenterology;  Laterality: N/A;  IDDM   CORONARY ARTERY BYPASS GRAFT  2000   ESOPHAGOGASTRODUODENOSCOPY N/A 10/02/2021   Procedure: ESOPHAGOGASTRODUODENOSCOPY (EGD);  Surgeon: Toledo, Benay Pike, MD;  Location: [facility] ENDOSCOPY;  Service: Gastroenterology;  Laterality: N/A;    SOCIAL HISTORY: Social History   Socioeconomic History   Marital status: Married    Spouse name: Not on file   Number of children: Not on file   Years of education: Not on file   Highest education level: Not on file  Occupational History   Not on file  Tobacco Use   Smoking status: Never   Smokeless tobacco: Never  Vaping Use   Vaping Use: Never used  Substance and Sexual Activity   Alcohol use: Never   Drug use: Never   Sexual activity: Not on file  Other Topics Concern   Not on file  Social History Narrative   Not on file   Social Determinants of Health   Financial Resource Strain: Not on file  Food Insecurity: Not on file  Transportation Needs: Not on file  Physical Activity: Not on file  Stress: Not on file  Social Connections: Not on file  Intimate Partner Violence: Not on file    FAMILY HISTORY: Family History  Problem Relation Age of Onset   Parkinson's disease Mother    Heart disease Father    Stomach cancer Maternal Uncle     ALLERGIES:  is allergic to amoxicillin-pot clavulanate, other, shellfish allergy, and simvastatin.  MEDICATIONS:  Current Outpatient Medications  Medication Sig Dispense Refill   acetaminophen (TYLENOL) 500 MG tablet Take 1,000 mg by mouth every 6 (six) hours as needed for moderate pain or headache.      alendronate (FOSAMAX) 70 MG tablet Take 70 mg by mouth once a week. Take with a full glass of water on an empty stomach.     Ascorbic Acid (VITAMIN C) 1000 MG  tablet Take 1,000 mg by mouth daily after supper.      aspirin 325 MG tablet Take 325 mg by mouth every evening.      chlorthalidone (HYGROTON) 25 MG tablet Take 12.5 mg by mouth every Monday, Wednesday, and Friday.      cholecalciferol (VITAMIN D) 1000 units tablet Take 1,000 Units by mouth daily after supper.      ferrous sulfate 325 (65 FE) MG EC tablet Take 1 tablet (325 mg total) by mouth daily. 90 tablet 1   fluocinonide cream (LIDEX) 3.38 % Apply 1 application topically 3 (three) times daily as needed (for itching).      gabapentin (NEURONTIN) 100 MG capsule Take 100 mg by mouth daily.     Glucosamine-Chondroitin (OSTEO BI-FLEX REGULAR STRENGTH PO) Take 1 tablet by mouth daily after supper.     insulin aspart (NOVOLOG) 100 UNIT/ML injection Inject into the skin daily. Via insulin pump     Insulin Human (INSULIN PUMP) SOLN Inject into the skin. insulin aspart (NOVOLOG) 100 unit/ml     losartan (COZAAR) 50 MG tablet Take 50 mg  by mouth daily.      metoprolol succinate (TOPROL-XL) 50 MG 24 hr tablet Take 25 mg by mouth 2 (two) times daily.      Multiple Vitamin (MULTIVITAMIN WITH MINERALS) TABS tablet Take 1 tablet by mouth daily. Multivitamin for Women 50+     NATURAL PSYLLIUM SEED PO Take 2 capsules by mouth daily at 3 pm.      pantoprazole (PROTONIX) 40 MG tablet Take 40 mg by mouth daily before breakfast.      Probiotic Product (PROBIOTIC DAILY PO) Take 1 capsule by mouth daily after supper.     No current facility-administered medications for this visit.     PHYSICAL EXAMINATION: ECOG PERFORMANCE STATUS: 1 - Symptomatic but completely ambulatory Vitals:   04/28/22 0934  BP: (!) 138/47  Pulse: (!) 52  Resp: 18  Temp: 97.6 F (36.4 C)   Filed Weights   04/28/22 0934  Weight: 156 lb 6.4 oz (70.9 kg)    Physical Exam Constitutional:      General: She is not in acute distress. HENT:     Head: Normocephalic and atraumatic.  Eyes:     General: No scleral  icterus. Cardiovascular:     Rate and Rhythm: Normal rate and regular rhythm.     Heart sounds: Normal heart sounds.     Comments: Occasional PVCs Pulmonary:     Effort: Pulmonary effort is normal. No respiratory distress.     Breath sounds: No wheezing.  Abdominal:     General: Bowel sounds are normal. There is no distension.     Palpations: Abdomen is soft.  Musculoskeletal:        General: No deformity. Normal range of motion.     Cervical back: Normal range of motion and neck supple.  Skin:    General: Skin is warm and dry.     Findings: No erythema or rash.  Neurological:     Mental Status: She is alert and oriented to person, place, and time. Mental status is at baseline.     Cranial Nerves: No cranial nerve deficit.     Coordination: Coordination normal.  Psychiatric:        Mood and Affect: Mood normal.     LABORATORY DATA:  I have reviewed the data as listed Lab Results  Component Value Date   WBC 14.8 (H) 04/21/2022   HGB 10.7 (L) 04/21/2022   HCT 31.5 (L) 04/21/2022   MCV 86.1 04/21/2022   PLT 249 04/21/2022   Recent Labs    10/11/21 1056 04/21/22 1015  NA 129* 132*  K 4.4 4.5  CL 92* 99  CO2 28 27  GLUCOSE 131* 102*  BUN 17 14  CREATININE 0.62 0.71  CALCIUM 9.3 8.8*  GFRNONAA >60 >60  PROT 6.5 6.2*  ALBUMIN 4.2 3.8  AST 23 26  ALT 23 21  ALKPHOS 66 65  BILITOT 0.9 1.2    Iron/TIBC/Ferritin/ %Sat    Component Value Date/Time   IRON 96 04/21/2022 1015   TIBC 363 04/21/2022 1015   FERRITIN 31 04/21/2022 1015   IRONPCTSAT 27 04/21/2022 1015      RADIOGRAPHIC STUDIES: I have personally reviewed the radiological images as listed and agreed with the findings in the report. No results found.    ASSESSMENT & PLAN:  1. CLL (chronic lymphocytic leukemia) ([place])   2. Iron deficiency anemia, unspecified iron deficiency anemia type   3. 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  No consitutional symptoms, continue watchful waiting.    IGVH test was not successfl due to low B cell clonal band.   Labs reviewed and discussed with patient.  Stable total white.  No B symptoms. Continue watchful waiting.   #Iron deficiency anemia, hemoglobin 7.7 iron panel shows normal ferritin and iron saturation.  Recommend patient to decrease oral iron supplementation to every other day. 10/02/2021, EGD and colonoscopy was performed.  Patient has a nonmalignant polyp which was removed.   #IgM MGUS, Multiple myeloma panel showed IgM monoclonal protein with kappa light chain specificity.  M protein not observed. Light chain ratio has decreased. UPEP is pending.  Previous UPEP done done due to deterioration of specimen. Continue observation.  Orders Placed This Encounter  Procedures   CBC with Differential/Platelet    Standing Status:   Standing    Number of Occurrences:   2    Standing Expiration Date:   04/29/2023   Comprehensive metabolic panel    Standing Status:   Standing    Number of Occurrences:   2    Standing Expiration Date:   04/29/2023   Iron and TIBC    Standing Status:   Standing    Number of Occurrences:   2    Standing Expiration Date:   04/29/2023   Ferritin    Standing Status:   Standing    Number of Occurrences:   2    Standing Expiration Date:   04/29/2023   Kappa/lambda light chains    Standing Status:   Standing    Number of Occurrences:   2    Standing Expiration Date:   04/29/2023   Multiple Myeloma Panel (SPEP&IFE w/QIG)    Standing Status:   Standing    Number of Occurrences:   2    Standing Expiration Date:   04/29/2023   IFE+PROTEIN ELECTRO, 24-HR UR    Standing Status:   Standing    Number of Occurrences:   2    Standing Expiration Date:   04/29/2023    All questions were answered. The patient knows to call the clinic with any problems questions or concerns.  cc Baxter Hire, MD    Return of visit:  6 months, labs 1  week prior to MD same labs + 24 upep 12 months, labs 1 week prior to MD same labs + 24 upep  Earlie Server, MD, PhD 04/28/2022

## 2022-05-26 ENCOUNTER — Other Ambulatory Visit
Admission: RE | Admit: 2022-05-26 | Discharge: 2022-05-26 | Disposition: A | Discharge status: Home / Self Care | Payer: Medicare HMO | Source: Ambulatory Visit | Attending: Gastroenterology | Admitting: Gastroenterology

## 2022-05-26 DIAGNOSIS — A0471 Enterocolitis due to Clostridium difficile, recurrent: Secondary | ICD-10-CM | POA: Insufficient documentation

## 2022-05-26 LAB — C DIFFICILE QUICK SCREEN W PCR REFLEX
C Diff antigen: NEGATIVE
C Diff interpretation: NOT DETECTED
C Diff toxin: NEGATIVE

## 2022-10-29 ENCOUNTER — Other Ambulatory Visit: Payer: Medicare HMO

## 2022-11-03 ENCOUNTER — Ambulatory Visit: Payer: Medicare HMO | Admitting: Oncology

## 2022-11-06 ENCOUNTER — Inpatient Hospital Stay: Payer: Medicare HMO | Attending: Oncology

## 2022-11-06 DIAGNOSIS — D472 Monoclonal gammopathy: Secondary | ICD-10-CM | POA: Insufficient documentation

## 2022-11-06 DIAGNOSIS — Z7982 Long term (current) use of aspirin: Secondary | ICD-10-CM | POA: Diagnosis not present

## 2022-11-06 DIAGNOSIS — E1036 Type 1 diabetes mellitus with diabetic cataract: Secondary | ICD-10-CM | POA: Diagnosis not present

## 2022-11-06 DIAGNOSIS — K219 Gastro-esophageal reflux disease without esophagitis: Secondary | ICD-10-CM | POA: Insufficient documentation

## 2022-11-06 DIAGNOSIS — D509 Iron deficiency anemia, unspecified: Secondary | ICD-10-CM | POA: Diagnosis not present

## 2022-11-06 DIAGNOSIS — Z794 Long term (current) use of insulin: Secondary | ICD-10-CM | POA: Diagnosis not present

## 2022-11-06 DIAGNOSIS — I10 Essential (primary) hypertension: Secondary | ICD-10-CM | POA: Diagnosis not present

## 2022-11-06 DIAGNOSIS — I2510 Atherosclerotic heart disease of native coronary artery without angina pectoris: Secondary | ICD-10-CM | POA: Insufficient documentation

## 2022-11-06 DIAGNOSIS — Z79899 Other long term (current) drug therapy: Secondary | ICD-10-CM | POA: Insufficient documentation

## 2022-11-06 DIAGNOSIS — E785 Hyperlipidemia, unspecified: Secondary | ICD-10-CM | POA: Insufficient documentation

## 2022-11-06 DIAGNOSIS — C9110 Chronic lymphocytic leukemia of B-cell type not having achieved remission: Secondary | ICD-10-CM | POA: Insufficient documentation

## 2022-11-06 LAB — COMPREHENSIVE METABOLIC PANEL
ALT: 27 U/L (ref 0–44)
AST: 33 U/L (ref 15–41)
Albumin: 4 g/dL (ref 3.5–5.0)
Alkaline Phosphatase: 59 U/L (ref 38–126)
Anion gap: 8 (ref 5–15)
BUN: 20 mg/dL (ref 8–23)
CO2: 25 mmol/L (ref 22–32)
Calcium: 9 mg/dL (ref 8.9–10.3)
Chloride: 101 mmol/L (ref 98–111)
Creatinine, Ser: 0.7 mg/dL (ref 0.44–1.00)
GFR, Estimated: >60 mL/min (ref >60)
Glucose, Bld: 81 mg/dL (ref 70–99)
Potassium: 4.2 mmol/L (ref 3.5–5.1)
Sodium: 134 mmol/L — ABNORMAL LOW (ref 135–145)
Total Bilirubin: 1.1 mg/dL (ref 0.3–1.2)
Total Protein: 6.3 g/dL — ABNORMAL LOW (ref 6.5–8.1)

## 2022-11-06 LAB — CBC WITH DIFFERENTIAL/PLATELET
Abs Immature Granulocytes: 0.09 10*3/uL — ABNORMAL HIGH (ref 0.00–0.07)
Basophils Absolute: 0.1 10*3/uL (ref 0.0–0.1)
Basophils Relative: 1 %
Eosinophils Absolute: 0.3 10*3/uL (ref 0.0–0.5)
Eosinophils Relative: 2 %
HCT: 32.3 % — ABNORMAL LOW (ref 36.0–46.0)
Hemoglobin: 11 g/dL — ABNORMAL LOW (ref 12.0–15.0)
Immature Granulocytes: 1 %
Lymphocytes Relative: 67 %
Lymphs Abs: 11 10*3/uL — ABNORMAL HIGH (ref 0.7–4.0)
MCH: 29.6 pg (ref 26.0–34.0)
MCHC: 34.1 g/dL (ref 30.0–36.0)
MCV: 86.8 fL (ref 80.0–100.0)
Monocytes Absolute: 0.6 10*3/uL (ref 0.1–1.0)
Monocytes Relative: 4 %
Neutro Abs: 4.1 10*3/uL (ref 1.7–7.7)
Neutrophils Relative %: 25 %
Platelets: 226 10*3/uL (ref 150–400)
RBC: 3.72 MIL/uL — ABNORMAL LOW (ref 3.87–5.11)
RDW: 13.9 % (ref 11.5–15.5)
WBC: 16.2 10*3/uL — ABNORMAL HIGH (ref 4.0–10.5)
nRBC: 0 % (ref 0.0–0.2)

## 2022-11-06 LAB — IRON AND TIBC
Iron: 71 ug/dL (ref 28–170)
Saturation Ratios: 18 % (ref 10.4–31.8)
TIBC: 389 ug/dL (ref 250–450)
UIBC: 318 ug/dL

## 2022-11-06 LAB — FERRITIN: Ferritin: 19 ng/mL (ref 11–307)

## 2022-11-07 LAB — KAPPA/LAMBDA LIGHT CHAINS
Kappa free light chain: 49.1 mg/L — ABNORMAL HIGH (ref 3.3–19.4)
Kappa, lambda light chain ratio: 6.73 — ABNORMAL HIGH (ref 0.26–1.65)
Lambda free light chains: 7.3 mg/L (ref 5.7–26.3)

## 2022-11-12 LAB — MULTIPLE MYELOMA PANEL, SERUM
Albumin SerPl Elph-Mcnc: 3.8 g/dL (ref 2.9–4.4)
Albumin/Glob SerPl: 2.1 — ABNORMAL HIGH (ref 0.7–1.7)
Alpha 1: 0.2 g/dL (ref 0.0–0.4)
Alpha2 Glob SerPl Elph-Mcnc: 0.7 g/dL (ref 0.4–1.0)
B-Globulin SerPl Elph-Mcnc: 0.7 g/dL (ref 0.7–1.3)
Gamma Glob SerPl Elph-Mcnc: 0.3 g/dL — ABNORMAL LOW (ref 0.4–1.8)
Globulin, Total: 1.9 g/dL — ABNORMAL LOW (ref 2.2–3.9)
IgA: 27 mg/dL — ABNORMAL LOW (ref 64–422)
IgG (Immunoglobin G), Serum: 323 mg/dL — ABNORMAL LOW (ref 586–1602)
IgM (Immunoglobulin M), Srm: 151 mg/dL (ref 26–217)
Total Protein ELP: 5.7 g/dL — ABNORMAL LOW (ref 6.0–8.5)

## 2022-11-13 ENCOUNTER — Encounter: Payer: Self-pay | Admitting: Oncology

## 2022-11-13 ENCOUNTER — Inpatient Hospital Stay (HOSPITAL_BASED_OUTPATIENT_CLINIC_OR_DEPARTMENT_OTHER): Payer: Medicare HMO | Admitting: Oncology

## 2022-11-13 VITALS — BP 173/75 | HR 57 | Temp 97.4°F | Resp 18 | Wt 155.8 lb

## 2022-11-13 DIAGNOSIS — C9110 Chronic lymphocytic leukemia of B-cell type not having achieved remission: Secondary | ICD-10-CM

## 2022-11-13 DIAGNOSIS — D472 Monoclonal gammopathy: Secondary | ICD-10-CM

## 2022-11-13 DIAGNOSIS — D508 Other iron deficiency anemias: Secondary | ICD-10-CM

## 2022-11-13 NOTE — Assessment & Plan Note (Addendum)
Stable iron panel.  Recommend patient to increase frequency of oral iron supplementation to daily

## 2022-11-13 NOTE — Assessment & Plan Note (Addendum)
#  IgM MGUS, Multiple myeloma panel showed IgM monoclonal protein with kappa light chain specificity.  M protein not observed. Light chain ratio is stable.  UPEP is pending.   Continue observation

## 2022-11-13 NOTE — Progress Notes (Signed)
Hematology/Oncology Progress  note [phone] [phone]   CHIEF COMPLAINTS/REASON FOR VISIT:  Follow up for CLL and anemia   ASSESSMENT & PLAN:   CLL (chronic lymphocytic leukemia) (HCC) #CLL, 13 q. 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  No consitutional symptoms, continue watchful waiting.    IGVH test was not successfl due to low B cell clonal band.   Labs reviewed and discussed with patient.  Stable total white count.  No B symptoms. Continue watchful waiting.  MGUS (monoclonal gammopathy of unknown significance) #IgM MGUS, Multiple myeloma panel showed IgM monoclonal protein with kappa light chain specificity.  M protein not observed. Light chain ratio is stable.  UPEP is pending.   Continue observation  IDA (iron deficiency anemia) Stable iron panel.  Recommend patient to increase frequency of oral iron supplementation to daily   All questions were answered. The patient knows to call the clinic with any problems, questions or concerns.  Earlie Server, MD, PhD [facility] Hematology Oncology 11/13/2022   HISTORY OF PRESENTING ILLNESS:   Cassandra Gentry is a  89 y.o.  female with PMH listed below was seen in consultation at the request of  Baxter Hire, MD  for evaluation of CLL and anemia  Reports a diagnosis of CLL and she previously follows up with [facility] oncology.  She participated in a clinical trial.  Patient lost to follow-up since the start of COVID-19 pandemic. Patient denies ever being on any treatment for CLL.  Denies any constitutional symptoms 05/04/2020, CBC showed hemoglobin 8.6, hematocrit 27, elevated white count 22.8, MCV 75.6, Patient was referred to hematology to establish care . She follows up with endocrinology for type 1 diabetes and was seen by Dr. Honor Junes on 09/12/2020.  Denies any change of bowel habit, bloody or black stool.  She reports intermittent rectal bleeding which she attributes to hemorrhoid  bleeding.  # I obtained and reviewed oncology medical records at [facility]. Last seen by Dr. Dorothea Ogle on 12/01/2017. a. Incidentally noted to have a mild leukocytosis on a routine annual examination, 11/09.  b. Initial hematologic evaluation 07/22/08 shows WBC 13.5, HGB 12.5, PLT 214.  Peripheral blood flow cytometry shows CD5, CD19, CD23, kappa restricted monoclonal B-cell population comprising 38% of peripheral blood events, CD38 negative, consistent with a diagnosis of chronic lymphocytic leukemia.   c. 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  10/02/2021, EGD and colonoscopy was performed.  Patient has a nonmalignant polyp which was removed.  INTERVAL HISTORY Cassandra Gentry is a 89 y.o. female who has above history reviewed by me today presents for follow up visit for CLL, iron deficiency anemia, MGUS Her husband passed away in 08-25-2022. She is coping well. Patient takes oral iron supplementation.  She has no new complaints. Chronic fatigue, unchanged. Weight is stable.   :Review of Systems  Constitutional:  Positive for fatigue. Negative for appetite change, chills and fever.  HENT:   Negative for hearing loss and voice change.   Eyes:  Negative for eye problems.  Respiratory:  Negative for chest tightness and cough.   Cardiovascular:  Negative for chest pain.  Gastrointestinal:  Negative for abdominal distention, abdominal pain and blood in stool.  Endocrine: Negative for hot flashes.  Genitourinary:  Negative for difficulty urinating and frequency.   Musculoskeletal:  Negative for arthralgias.  Skin:  Negative for itching and rash.  Neurological:  Negative for extremity weakness.  Hematological:  Negative for adenopathy.  Psychiatric/Behavioral:  Negative for confusion.     MEDICAL HISTORY:   Past Medical History:  Diagnosis Date   Chronic lymphocytic leukemia ([place])    Complication of anesthesia    SICK X 3 DAYS AFTER LAST CATARACT. LM FOR HER TO CALL BACK WITH MORE INFO   Coronary artery disease    Diabetes mellitus without complication (HCC)    Diverticulosis    Dysrhythmia    Edema    LEFT LEG   GERD (gastroesophageal reflux disease)    Hyperlipidemia    Hypertension    Thyroid disease     SURGICAL HISTORY: Past Surgical History:  Procedure Laterality Date   CATARACT EXTRACTION W/PHACO Left 06/08/2018   Procedure: CATARACT EXTRACTION PHACO AND INTRAOCULAR LENS PLACEMENT ([place]);  Surgeon: Birder Robson, MD;  Location: [facility] ORS;  Service: Ophthalmology;  Laterality: Left;  [place] 02:20 CDE 23.41 Fluid pack lot # [id number]   CATARACT EXTRACTION W/PHACO Right 07/13/2018   Procedure: CATARACT EXTRACTION PHACO AND INTRAOCULAR LENS PLACEMENT ([facility]);  Surgeon: Birder Robson, MD;  Location: [facility] ORS;  Service: Ophthalmology;  Laterality: Right;  [place] 01:18.2 CDE 15.42 Fluid Pack Lot # [id number]   COLONOSCOPY WITH PROPOFOL N/A 10/02/2021   Procedure: COLONOSCOPY WITH PROPOFOL;  Surgeon: Toledo, Benay Pike, MD;  Location: [facility] ENDOSCOPY;  Service: Gastroenterology;  Laterality: N/A;  IDDM   CORONARY ARTERY BYPASS GRAFT  2000   ESOPHAGOGASTRODUODENOSCOPY N/A 10/02/2021   Procedure: ESOPHAGOGASTRODUODENOSCOPY (EGD);  Surgeon: Toledo, Benay Pike, MD;  Location: [facility] ENDOSCOPY;  Service: Gastroenterology;  Laterality: N/A;    SOCIAL HISTORY: Social History   Socioeconomic History   Marital status: Widowed    Spouse name: Not on file   Number of children: Not on file   Years of education: Not on file   Highest education level: Not on file  Occupational History   Not on file  Tobacco Use   Smoking status: Never   Smokeless tobacco: Never  Vaping Use   Vaping Use: Never used  Substance and Sexual Activity   Alcohol use: Never   Drug use: Never   Sexual activity: Not on  file  Other Topics Concern   Not on file  Social History Narrative   Not on file   Social Determinants of Health   Financial Resource Strain: Not on file  Food Insecurity: Not on file  Transportation Needs: Not on file  Physical Activity: Not on file  Stress: Not on file  Social Connections: Not on file  Intimate Partner Violence: Not on file    FAMILY HISTORY: Family History  Problem Relation Age of Onset   Parkinson's disease Mother    Heart disease Father    Stomach cancer Maternal Uncle     ALLERGIES:  is allergic to amoxicillin-pot clavulanate, other, shellfish allergy, and simvastatin.  MEDICATIONS:  Current Outpatient Medications  Medication Sig Dispense Refill   acetaminophen (TYLENOL) 500 MG tablet Take 1,000 mg by mouth every 6 (six) hours as needed for moderate pain or headache.      alendronate (FOSAMAX) 70 MG tablet Take 70 mg by mouth once a week. Take with a full glass of water on an empty stomach.     Ascorbic Acid (VITAMIN C) 1000 MG tablet Take 1,000 mg by mouth daily after supper.      aspirin EC 81 MG tablet Take 81 mg by mouth daily. Swallow whole.  chlorthalidone (HYGROTON) 25 MG tablet Take 12.5 mg by mouth every Monday, Wednesday, and Friday.      cholecalciferol (VITAMIN D) 1000 units tablet Take 1,000 Units by mouth daily after supper.      ezetimibe (ZETIA) 10 MG tablet Take 10 mg by mouth daily.     ferrous sulfate 325 (65 FE) MG EC tablet Take 1 tablet (325 mg total) by mouth daily. 90 tablet 1   fluocinonide cream (LIDEX) [id number] % Apply 1 application topically 3 (three) times daily as needed (for itching).      gabapentin (NEURONTIN) 100 MG capsule Take 100 mg by mouth daily.     Glucosamine-Chondroitin (OSTEO BI-FLEX REGULAR STRENGTH PO) Take 1 tablet by mouth daily after supper.     insulin aspart (NOVOLOG) 100 UNIT/ML injection Inject into the skin daily. Via insulin pump     Insulin Human (INSULIN PUMP) SOLN Inject into the skin. insulin  aspart (NOVOLOG) 100 unit/ml     losartan (COZAAR) 50 MG tablet Take 50 mg by mouth daily.      metoprolol succinate (TOPROL-XL) 50 MG 24 hr tablet Take 25 mg by mouth 2 (two) times daily.      Multiple Vitamin (MULTIVITAMIN WITH MINERALS) TABS tablet Take 1 tablet by mouth daily. Multivitamin for Women 50+     NATURAL PSYLLIUM SEED PO Take 2 capsules by mouth daily at 3 pm.      pantoprazole (PROTONIX) 40 MG tablet Take 40 mg by mouth daily before breakfast.      Probiotic Product (PROBIOTIC DAILY PO) Take 1 capsule by mouth daily after supper.     No current facility-administered medications for this visit.     PHYSICAL EXAMINATION: ECOG PERFORMANCE STATUS: 1 - Symptomatic but completely ambulatory Vitals:   11/13/22 0917  BP: (!) 173/75  Pulse: (!) 57  Resp: 18  Temp: (!) 97.4 F (36.3 C)  SpO2: 100%   Filed Weights   11/13/22 0917  Weight: 155 lb 12.8 oz (70.7 kg)    Physical Exam HENT:     Head: Normocephalic and atraumatic.  Eyes:     General: No scleral icterus. Cardiovascular:     Rate and Rhythm: Normal rate.     Heart sounds: Normal heart sounds.  Pulmonary:     Effort: Pulmonary effort is normal. No respiratory distress.     Breath sounds: No wheezing.  Abdominal:     General: Bowel sounds are normal. There is no distension.     Palpations: Abdomen is soft.  Musculoskeletal:        General: No deformity. Normal range of motion.     Cervical back: Normal range of motion and neck supple.  Skin:    General: Skin is warm and dry.     Findings: No erythema or rash.  Neurological:     Mental Status: She is alert and oriented to person, place, and time. Mental status is at baseline.     Cranial Nerves: No cranial nerve deficit.     Coordination: Coordination normal.  Psychiatric:        Mood and Affect: Mood normal.     LABORATORY DATA:  I have reviewed the data as listed    Latest Ref Rng & Units 11/06/2022    1:52 PM 04/21/2022   10:15 AM 10/11/2021    10:56 AM  CBC  WBC 4.0 - 10.5 K/uL 16.2  14.8  16.8   Hemoglobin 12.0 - 15.0 g/dL 11.0  10.7  11.1  Hematocrit 36.0 - 46.0 % 32.3  31.5  32.1   Platelets 150 - 400 K/uL 226  249  272       Latest Ref Rng & Units 11/06/2022    1:52 PM 04/21/2022   10:15 AM 10/11/2021   10:56 AM  CMP  Glucose 70 - 99 mg/dL 81  102  131   BUN 8 - 23 mg/dL '20  14  17   '$ Creatinine 0.44 - 1.00 mg/dL 0.70  0.71  0.62   Sodium 135 - 145 mmol/L 134  132  129   Potassium 3.5 - 5.1 mmol/L 4.2  4.5  4.4   Chloride 98 - 111 mmol/L 101  99  92   CO2 22 - 32 mmol/L '25  27  28   '$ Calcium 8.9 - 10.3 mg/dL 9.0  8.8  9.3   Total Protein 6.5 - 8.1 g/dL 6.3  6.2  6.5   Total Bilirubin 0.3 - 1.2 mg/dL 1.1  1.2  0.9   Alkaline Phos 38 - 126 U/L 59  65  66   AST 15 - 41 U/L 33  26  23   ALT 0 - 44 U/L '27  21  23      '$ Iron/TIBC/Ferritin/ %Sat    Component Value Date/Time   IRON 71 11/06/2022 1352   TIBC 389 11/06/2022 1352   FERRITIN 19 11/06/2022 1352   IRONPCTSAT 18 11/06/2022 1352      RADIOGRAPHIC STUDIES: I have personally reviewed the radiological images as listed and agreed with the findings in the report. No results found.

## 2022-11-13 NOTE — Assessment & Plan Note (Addendum)
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  No consitutional symptoms, continue watchful waiting.    IGVH test was not successfl due to low B cell clonal band.   Labs reviewed and discussed with patient.  Stable total white count.  No B symptoms. Continue watchful waiting.

## 2022-11-17 ENCOUNTER — Other Ambulatory Visit: Payer: Self-pay

## 2022-11-17 DIAGNOSIS — C9110 Chronic lymphocytic leukemia of B-cell type not having achieved remission: Secondary | ICD-10-CM | POA: Diagnosis not present

## 2022-11-17 DIAGNOSIS — D509 Iron deficiency anemia, unspecified: Secondary | ICD-10-CM

## 2022-11-17 DIAGNOSIS — D472 Monoclonal gammopathy: Secondary | ICD-10-CM

## 2022-11-18 LAB — IFE+PROTEIN ELECTRO, 24-HR UR
% BETA, Urine: 70.9 %
ALPHA 1 URINE: 1.5 %
Albumin, U: 9.7 %
Alpha 2, Urine: 3.9 %
GAMMA GLOBULIN URINE: 14.1 %
M-SPIKE %, Urine: 48.3 % — ABNORMAL HIGH
M-Spike, Mg/24 Hr: 80 mg/24 hr — ABNORMAL HIGH
Total Protein, Urine-Ur/day: 165 mg/24 hr — ABNORMAL HIGH (ref 30–150)
Total Protein, Urine: 9.7 mg/dL
Total Volume: 1700

## 2022-11-19 ENCOUNTER — Encounter: Payer: Self-pay | Admitting: Oncology

## 2023-04-08 ENCOUNTER — Emergency Department: Payer: Medicare HMO

## 2023-04-08 ENCOUNTER — Other Ambulatory Visit: Payer: Self-pay

## 2023-04-08 ENCOUNTER — Emergency Department
Admission: EM | Admit: 2023-04-08 | Discharge: 2023-04-08 | Disposition: A | Discharge status: Home / Self Care | Payer: Medicare HMO | Attending: Emergency Medicine | Admitting: Emergency Medicine

## 2023-04-08 DIAGNOSIS — E119 Type 2 diabetes mellitus without complications: Secondary | ICD-10-CM | POA: Insufficient documentation

## 2023-04-08 DIAGNOSIS — Z951 Presence of aortocoronary bypass graft: Secondary | ICD-10-CM | POA: Diagnosis not present

## 2023-04-08 DIAGNOSIS — Z1152 Encounter for screening for COVID-19: Secondary | ICD-10-CM | POA: Insufficient documentation

## 2023-04-08 DIAGNOSIS — E871 Hypo-osmolality and hyponatremia: Secondary | ICD-10-CM | POA: Diagnosis not present

## 2023-04-08 DIAGNOSIS — J069 Acute upper respiratory infection, unspecified: Secondary | ICD-10-CM | POA: Insufficient documentation

## 2023-04-08 DIAGNOSIS — D72829 Elevated white blood cell count, unspecified: Secondary | ICD-10-CM | POA: Diagnosis not present

## 2023-04-08 DIAGNOSIS — R059 Cough, unspecified: Secondary | ICD-10-CM | POA: Diagnosis present

## 2023-04-08 LAB — CBC
HCT: 33 % — ABNORMAL LOW (ref 36.0–46.0)
Hemoglobin: 11.2 g/dL — ABNORMAL LOW (ref 12.0–15.0)
MCH: 29.6 pg (ref 26.0–34.0)
MCHC: 33.9 g/dL (ref 30.0–36.0)
MCV: 87.1 fL (ref 80.0–100.0)
Platelets: 454 10*3/uL — ABNORMAL HIGH (ref 150–400)
RBC: 3.79 MIL/uL — ABNORMAL LOW (ref 3.87–5.11)
RDW: 13.1 % (ref 11.5–15.5)
WBC: 17.9 10*3/uL — ABNORMAL HIGH (ref 4.0–10.5)
nRBC: 0 % (ref 0.0–0.2)

## 2023-04-08 LAB — COMPREHENSIVE METABOLIC PANEL
ALT: 39 U/L (ref 0–44)
AST: 29 U/L (ref 15–41)
Albumin: 3.8 g/dL (ref 3.5–5.0)
Alkaline Phosphatase: 82 U/L (ref 38–126)
Anion gap: 10 (ref 5–15)
BUN: 16 mg/dL (ref 8–23)
CO2: 23 mmol/L (ref 22–32)
Calcium: 9.1 mg/dL (ref 8.9–10.3)
Chloride: 98 mmol/L (ref 98–111)
Creatinine, Ser: 0.75 mg/dL (ref 0.44–1.00)
GFR, Estimated: >60 mL/min (ref >60)
Glucose, Bld: 114 mg/dL — ABNORMAL HIGH (ref 70–99)
Potassium: 4.3 mmol/L (ref 3.5–5.1)
Sodium: 131 mmol/L — ABNORMAL LOW (ref 135–145)
Total Bilirubin: 0.7 mg/dL (ref 0.3–1.2)
Total Protein: 6.7 g/dL (ref 6.5–8.1)

## 2023-04-08 LAB — RESP PANEL BY RT-PCR (RSV, FLU A&B, COVID)  RVPGX2
Influenza A by PCR: NEGATIVE
Influenza B by PCR: NEGATIVE
Resp Syncytial Virus by PCR: NEGATIVE
SARS Coronavirus 2 by RT PCR: NEGATIVE

## 2023-04-08 LAB — TROPONIN I (HIGH SENSITIVITY)
Troponin I (High Sensitivity): 5 ng/L (ref <18)
Troponin I (High Sensitivity): 5 ng/L (ref <18)

## 2023-04-08 MED ORDER — ACETAMINOPHEN 325 MG PO TABS
650.0000 mg | ORAL_TABLET | Freq: Once | ORAL | Status: AC
  Administered 2023-04-08: 650 mg via ORAL
  Filled 2023-04-08: qty 2

## 2023-04-08 MED ORDER — DOXYCYCLINE HYCLATE 50 MG PO CAPS: 100.0000 mg | ORAL_CAPSULE | Freq: Two times a day (BID) | ORAL | 0 refills | Status: AC

## 2023-04-08 NOTE — ED Provider Notes (Signed)
[facility] Provider Note    Event Date/Time   First MD Initiated Contact with Patient 04/08/23 0701     (approximate)   History   Chest Pain   HPI Cassandra Gentry is a 89 y.o. female with history of CABG in 2004, diabetes mellitus, CLL who presents today for chest pain.  Patient states for the past 2 weeks she has had significant sinus congestion and nasal drainage.  She started developing a cough and a sore throat shortly after that.  Last night she started noticing a chest tightness which was more prominent with coughing.  She occasionally notes shortness of breath with ambulation but is not significantly out of breath.  She reports 2 different days of having a fever greater than 100 F.  Her cough has been productive with yellowish sputum.  The chest pain did get better with Tylenol.  Otherwise, denies nausea, vomiting, diarrhea, constipation, lower extremity pain or swelling, body aches.     Physical Exam   Triage Vital Signs: ED Triage Vitals  Encounter Vitals Group     BP 04/08/23 0644 (!) 160/67     Systolic BP Percentile --      Diastolic BP Percentile --      Pulse Rate 04/08/23 0641 75     Resp 04/08/23 0641 16     Temp 04/08/23 0644 99 F (37.2 C)     Temp src --      SpO2 04/08/23 0644 100 %     Weight --      Height --      Head Circumference --      Peak Flow --      Pain Score 04/08/23 0641 6     Pain Loc --      Pain Education --      Exclude from Growth Chart --     Most recent vital signs: Vitals:   04/08/23 0730 04/08/23 0900  BP: 132/70 (!) 117/48  Pulse: 67 62  Resp: (!) 21 19  Temp:    SpO2: 98% 92%    General: Awake, no distress.  HEENT: Sinus congestion nasal drainage present.  Mild posterior oropharynx erythema.  No tonsillar exudates.  No significant cervical lymphadenopathy. CV:  Good peripheral perfusion.  Regular rate and rhythm. Resp:  Normal effort.  No tachypnea or accessory muscle usage.  No wheezes,  rhonchi, or rales. Abd:  No distention.  Soft, nontender. Other:  No lower extremity edema or tenderness palpation.  Skin warm and perfused throughout.   ED Results / Procedures / Treatments   Labs (all labs ordered are listed, but only abnormal results are displayed) Labs Reviewed  CBC - Abnormal; Notable for the following components:      Result Value   WBC 17.9 (*)    RBC 3.79 (*)    Hemoglobin 11.2 (*)    HCT 33.0 (*)    Platelets 454 (*)    All other components within normal limits  COMPREHENSIVE METABOLIC PANEL - Abnormal; Notable for the following components:   Sodium 131 (*)    Glucose, Bld 114 (*)    All other components within normal limits  RESP PANEL BY RT-PCR (RSV, FLU A&B, COVID)  [id number]  TROPONIN I (HIGH SENSITIVITY)  TROPONIN I (HIGH SENSITIVITY)     EKG My EKG interpretation at 7:31 AM: Rate of 69, normal sinus rhythm, normal axis, no acute ST elevations or depressions.  Normal intervals.   RADIOLOGY I viewed  and interpreted chest x-ray showing normal lung volumes, no obvious focal consolidations.  No evidence of pleural effusion or pneumothorax.  Overall, no acute cardiopulmonary abnormalities.   PROCEDURES:  Critical Care performed: No  Procedures   MEDICATIONS ORDERED IN ED: Medications  acetaminophen (TYLENOL) tablet 650 mg (650 mg Oral Given 04/08/23 0753)     IMPRESSION / MDM / ASSESSMENT AND PLAN / ED COURSE  I reviewed the triage vital signs and the nursing notes.                              Differential diagnosis includes, but is not limited to, viral URI, bacterial sinusitis, pneumonia, bronchitis.  Patient's presentation is most consistent with acute, uncomplicated illness.  Patient presented with 2 weeks of congestion, intermittent fevers, cough which has progressed into some chest tightness with her cough.  She does have significant cardiac history but her chest pain does not seem specifically ACS in nature.  Initial EKG  reassuring.  Troponins of 5 and 5 with low likelihood of this being ACS related.  It seems more like a viral upper respiratory infection which could have progressed into a bacterial sinusitis given the chronicity of symptoms.  Vital signs otherwise stable and physical exam reassuring.  Rest of her blood work is reassuring.  Patient was started on doxycycline given her chronicity of symptoms and stating that she cannot take penicillins.  Patient was discharged with follow-up with her primary care provider and given strict return precautions.   Clinical Course as of 04/08/23 0935  Wed Apr 08, 2023  0734 CBC(!) Leukocytosis present, but patient does have a history of CLL and this is overall comparable with her baseline values. Could be masking viral/bacterial upper respiratory infection given ongoing nasal congestion. [DW]  0745 Resp panel by RT-PCR (RSV, Flu A&B, Covid) Anterior Nasal Swab Negative respiratory panel [DW]  [phone] Lab called due to delays in initial troponin and CMP that were sent 2 hours ago with no results [DW]  0912 Comprehensive metabolic panel(!) Mild hyponatremia with history of the same in the past. Overall, not significant [DW]  0915 Troponin I (High Sensitivity): 5 [DW]  0920 Reassessed patient.  Sleeping comfortably and in no acute distress. [DW]  1478 Troponin I (High Sensitivity): 5 No delta [DW]  0927 Patient's symptoms more consistent with upper respiratory infection no with chest tightness 2/2 prolonged cough. Given ongoing symptoms, will discharge on oral antibiotic [DW]    Clinical Course User Index [DW] Janith Lima, MD     FINAL CLINICAL IMPRESSION(S) / ED DIAGNOSES   Final diagnoses:  Upper respiratory tract infection, unspecified type     Rx / DC Orders   ED Discharge Orders          Ordered    doxycycline (VIBRAMYCIN) 50 MG capsule  2 times daily        04/08/23 0932             Note:  This document was prepared using Dragon voice  recognition software and may include unintentional dictation errors.   Janith Lima, MD 04/08/23 [phone]

## 2023-04-08 NOTE — ED Notes (Signed)
Patient resting with eyes closed. Respirations even and unlabored. NAD noted. Bed locked and lowered.

## 2023-04-08 NOTE — ED Notes (Signed)
Lab called at this time due to CMP and troponin not resulting.

## 2023-04-08 NOTE — ED Triage Notes (Signed)
Pt BIB EMS for chest pain that woke her up at 3am. Apparently pain started last night around 2300. Describes pain as a tightness. Received 324 ASA en route. Hx of diabetes.From [facility].

## 2023-04-08 NOTE — Discharge Instructions (Signed)
You were seen in the emergency department today for your sinus congestion and cough.  No evidence of any heart related damage I think this is all secondary to your cough causing her chest pain.  I have sent an oral antibiotic for you to take for the next 7 days.  Please follow-up with your primary care provider within the week for reassessment to make sure symptoms are resolving.  Please return if you notice severe worsening shortness of breath or other severe symptoms.

## 2023-04-29 ENCOUNTER — Inpatient Hospital Stay: Payer: Medicare HMO | Attending: Oncology

## 2023-04-29 DIAGNOSIS — D472 Monoclonal gammopathy: Secondary | ICD-10-CM | POA: Insufficient documentation

## 2023-04-29 DIAGNOSIS — D509 Iron deficiency anemia, unspecified: Secondary | ICD-10-CM

## 2023-04-29 DIAGNOSIS — C9110 Chronic lymphocytic leukemia of B-cell type not having achieved remission: Secondary | ICD-10-CM | POA: Insufficient documentation

## 2023-04-29 LAB — COMPREHENSIVE METABOLIC PANEL
ALT: 25 U/L (ref 0–44)
AST: 25 U/L (ref 15–41)
Albumin: 4 g/dL (ref 3.5–5.0)
Alkaline Phosphatase: 74 U/L (ref 38–126)
Anion gap: 7 (ref 5–15)
BUN: 21 mg/dL (ref 8–23)
CO2: 23 mmol/L (ref 22–32)
Calcium: 9.2 mg/dL (ref 8.9–10.3)
Chloride: 100 mmol/L (ref 98–111)
Creatinine, Ser: 0.78 mg/dL (ref 0.44–1.00)
GFR, Estimated: >60 mL/min (ref >60)
Glucose, Bld: 214 mg/dL — ABNORMAL HIGH (ref 70–99)
Potassium: 4.3 mmol/L (ref 3.5–5.1)
Sodium: 130 mmol/L — ABNORMAL LOW (ref 135–145)
Total Bilirubin: 1.2 mg/dL (ref 0.3–1.2)
Total Protein: 6.4 g/dL — ABNORMAL LOW (ref 6.5–8.1)

## 2023-04-29 LAB — CBC WITH DIFFERENTIAL/PLATELET
Abs Immature Granulocytes: 0.06 10*3/uL (ref 0.00–0.07)
Basophils Absolute: 0.1 10*3/uL (ref 0.0–0.1)
Basophils Relative: 1 %
Eosinophils Absolute: 0.3 10*3/uL (ref 0.0–0.5)
Eosinophils Relative: 2 %
HCT: 31.1 % — ABNORMAL LOW (ref 36.0–46.0)
Hemoglobin: 10.6 g/dL — ABNORMAL LOW (ref 12.0–15.0)
Immature Granulocytes: 0 %
Lymphocytes Relative: 60 %
Lymphs Abs: 8.7 10*3/uL — ABNORMAL HIGH (ref 0.7–4.0)
MCH: 29.1 pg (ref 26.0–34.0)
MCHC: 34.1 g/dL (ref 30.0–36.0)
MCV: 85.4 fL (ref 80.0–100.0)
Monocytes Absolute: 0.7 10*3/uL (ref 0.1–1.0)
Monocytes Relative: 5 %
Neutro Abs: 4.6 10*3/uL (ref 1.7–7.7)
Neutrophils Relative %: 32 %
Platelets: 272 10*3/uL (ref 150–400)
RBC: 3.64 MIL/uL — ABNORMAL LOW (ref 3.87–5.11)
RDW: 14.6 % (ref 11.5–15.5)
Smear Review: NORMAL
WBC: 14.6 10*3/uL — ABNORMAL HIGH (ref 4.0–10.5)
nRBC: 0 % (ref 0.0–0.2)

## 2023-04-29 LAB — IRON AND TIBC
Iron: 56 ug/dL (ref 28–170)
Saturation Ratios: 15 % (ref 10.4–31.8)
TIBC: 374 ug/dL (ref 250–450)
UIBC: 318 ug/dL

## 2023-04-29 LAB — FERRITIN: Ferritin: 41 ng/mL (ref 11–307)

## 2023-05-01 LAB — KAPPA/LAMBDA LIGHT CHAINS
Kappa free light chain: 42.2 mg/L — ABNORMAL HIGH (ref 3.3–19.4)
Kappa, lambda light chain ratio: 3.98 — ABNORMAL HIGH (ref 0.26–1.65)
Lambda free light chains: 10.6 mg/L (ref 5.7–26.3)

## 2023-05-04 LAB — MULTIPLE MYELOMA PANEL, SERUM
Albumin SerPl Elph-Mcnc: 3.7 g/dL (ref 2.9–4.4)
Albumin/Glob SerPl: 1.7 (ref 0.7–1.7)
Alpha 1: 0.3 g/dL (ref 0.0–0.4)
Alpha2 Glob SerPl Elph-Mcnc: 0.9 g/dL (ref 0.4–1.0)
B-Globulin SerPl Elph-Mcnc: 0.8 g/dL (ref 0.7–1.3)
Gamma Glob SerPl Elph-Mcnc: 0.3 g/dL — ABNORMAL LOW (ref 0.4–1.8)
Globulin, Total: 2.3 g/dL (ref 2.2–3.9)
IgA: 31 mg/dL — ABNORMAL LOW (ref 64–422)
IgG (Immunoglobin G), Serum: 281 mg/dL — ABNORMAL LOW (ref 586–1602)
IgM (Immunoglobulin M), Srm: 141 mg/dL (ref 26–217)
Total Protein ELP: 6 g/dL (ref 6.0–8.5)

## 2023-05-05 ENCOUNTER — Encounter: Payer: Self-pay | Admitting: Oncology

## 2023-05-05 ENCOUNTER — Inpatient Hospital Stay: Payer: Medicare HMO | Attending: Oncology | Admitting: Oncology

## 2023-05-05 VITALS — BP 144/52 | HR 105 | Temp 98.3°F | Resp 18 | Wt 152.1 lb

## 2023-05-05 DIAGNOSIS — Z7982 Long term (current) use of aspirin: Secondary | ICD-10-CM | POA: Diagnosis not present

## 2023-05-05 DIAGNOSIS — D508 Other iron deficiency anemias: Secondary | ICD-10-CM | POA: Diagnosis not present

## 2023-05-05 DIAGNOSIS — D472 Monoclonal gammopathy: Secondary | ICD-10-CM | POA: Diagnosis not present

## 2023-05-05 DIAGNOSIS — Z79899 Other long term (current) drug therapy: Secondary | ICD-10-CM | POA: Diagnosis not present

## 2023-05-05 DIAGNOSIS — D509 Iron deficiency anemia, unspecified: Secondary | ICD-10-CM | POA: Insufficient documentation

## 2023-05-05 DIAGNOSIS — C9110 Chronic lymphocytic leukemia of B-cell type not having achieved remission: Secondary | ICD-10-CM | POA: Insufficient documentation

## 2023-05-05 NOTE — Assessment & Plan Note (Addendum)
#  IgM MGUS, Multiple myeloma panel showed IgM monoclonal protein with kappa light chain specificity.  M protein not observed. Light chain ratio is stable/improved.   Continue observation

## 2023-05-05 NOTE — Assessment & Plan Note (Addendum)
Lab Results  Component Value Date   HGB 10.6 (L) 04/29/2023   TIBC 374 04/29/2023   IRONPCTSAT 15 04/29/2023   FERRITIN 41 04/29/2023    continue oral iron supplementation to daily Hemoglobin is worse, ? Due to recnet URI, recommend repeat level isn 2 months Empiric intake of vitamin b12 

## 2023-05-05 NOTE — Progress Notes (Signed)
Hematology/Oncology Progress  note [phone] [phone]) [phone]   CHIEF COMPLAINTS/REASON FOR VISIT:  Follow up for CLL and anemia   ASSESSMENT & PLAN:   CLL (chronic lymphocytic leukemia) (HCC) #CLL, 13 q. 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  No consitutional symptoms, continue watchful waiting.    IGVH test was not successfl due to low B cell clonal band.   Labs reviewed and discussed with patient.  Stable total white count.  No B symptoms. Hemoglobin is worse, unknown etiology. Repeat in 2 months.  Continue watchful waiting.  MGUS (monoclonal gammopathy of unknown significance) #IgM MGUS, Multiple myeloma panel showed IgM monoclonal protein with kappa light chain specificity.  M protein not observed. Light chain ratio is stable/improved.   Continue observation  IDA (iron deficiency anemia) Lab Results  Component Value Date   HGB 10.6 (L) 04/29/2023   TIBC 374 04/29/2023   IRONPCTSAT 15 04/29/2023   FERRITIN 41 04/29/2023    continue oral iron supplementation to daily Hemoglobin is worse, ? Due to recnet URI, recommend repeat level isn 2 months Empiric intake of vitamin b12   All questions were answered. The patient knows to call the clinic with any problems, questions or concerns.  Rickard Patience, MD, PhD [facility] Hematology Oncology 05/05/2023   HISTORY OF PRESENTING ILLNESS:   Cassandra Gentry is a  89 y.o.  female with PMH listed below was seen in consultation at the request of  Gracelyn Nurse, MD  for evaluation of CLL and anemia  Reports a diagnosis of CLL and she previously follows up with [facility] oncology.  She participated in a clinical trial.  Patient lost to follow-up since the start of COVID-19 pandemic. Patient denies ever being on any treatment for CLL.  Denies any constitutional symptoms 05/04/2020, CBC showed hemoglobin 8.6, hematocrit 27, elevated white count 22.8, MCV 75.6, Patient was referred to hematology to  establish care . She follows up with endocrinology for type 1 diabetes and was seen by Dr. Gershon Crane on 09/12/2020.  Denies any change of bowel habit, bloody or black stool.  She reports intermittent rectal bleeding which she attributes to hemorrhoid bleeding.  # I obtained and reviewed oncology medical records at [facility]. Last seen by Dr. Magnus Ivan on 12/01/2017. a. Incidentally noted to have a mild leukocytosis on a routine annual examination, 11/09.  b. Initial hematologic evaluation 07/22/08 shows WBC 13.5, HGB 12.5, PLT 214.  Peripheral blood flow cytometry shows CD5, CD19, CD23, kappa restricted monoclonal B-cell population comprising 38% of peripheral blood events, CD38 negative, consistent with a diagnosis of chronic lymphocytic leukemia.   c. Initial evaluation at [facility] 09/27/08 shows a WBC 13.8, peripheral blood interphase cytogenetics reveals deletion 13q14.3 in 81/200 cells and an immunoglobulin heavy chain translocation with an unknown translocation partner in 165/200 cells.  10/02/2021, EGD and colonoscopy was performed.  Patient has a nonmalignant polyp which was removed.  INTERVAL HISTORY Cassandra Gentry is a 89 y.o. female who has above history reviewed by me today presents for follow up visit for CLL, iron deficiency anemia, MGUS Her husband passed away in 08-25-2022. She is coping well. Patient takes oral iron supplementation.   Recently she has had URI, just finished doxycycline,  Chronic fatigue, unchanged. Weight is stable.   :Review of Systems  Constitutional:  Positive for fatigue. Negative for appetite change, chills and fever.  HENT:   Negative for hearing loss and voice change.   Eyes:  Negative for eye problems.  Respiratory:  Positive for cough. Negative for chest tightness.   Cardiovascular:  Negative for chest pain.  Gastrointestinal:  Negative for abdominal distention, abdominal pain and blood in stool.  Endocrine: Negative for hot flashes.  Genitourinary:  Negative for  difficulty urinating and frequency.   Musculoskeletal:  Negative for arthralgias.  Skin:  Negative for itching and rash.  Neurological:  Negative for extremity weakness.  Hematological:  Negative for adenopathy.  Psychiatric/Behavioral:  Negative for confusion.     MEDICAL HISTORY:  Past Medical History:  Diagnosis Date   Chronic lymphocytic leukemia (HCC)    Complication of anesthesia    SICK X 3 DAYS AFTER LAST CATARACT. LM FOR HER TO CALL BACK WITH MORE INFO   Coronary artery disease    Diabetes mellitus without complication (HCC)    Diverticulosis    Dysrhythmia    Edema    LEFT LEG   GERD (gastroesophageal reflux disease)    Hyperlipidemia    Hypertension    Thyroid disease     SURGICAL HISTORY: Past Surgical History:  Procedure Laterality Date   CATARACT EXTRACTION W/PHACO Left 06/08/2018   Procedure: CATARACT EXTRACTION PHACO AND INTRAOCULAR LENS PLACEMENT (IOC);  Surgeon: Galen Manila, MD;  Location: [facility] ORS;  Service: Ophthalmology;  Laterality: Left;  [place] 02:20 CDE 23.41 Fluid pack lot # [id number]   CATARACT EXTRACTION W/PHACO Right 07/13/2018   Procedure: CATARACT EXTRACTION PHACO AND INTRAOCULAR LENS PLACEMENT (IOC);  Surgeon: Galen Manila, MD;  Location: [facility] ORS;  Service: Ophthalmology;  Laterality: Right;  [place] 01:18.2 CDE 15.42 Fluid Pack Lot # [id number]   COLONOSCOPY WITH PROPOFOL N/A 10/02/2021   Procedure: COLONOSCOPY WITH PROPOFOL;  Surgeon: Toledo, Boykin Nearing, MD;  Location: [facility] ENDOSCOPY;  Service: Gastroenterology;  Laterality: N/A;  IDDM   CORONARY ARTERY BYPASS GRAFT  2000   ESOPHAGOGASTRODUODENOSCOPY N/A 10/02/2021   Procedure: ESOPHAGOGASTRODUODENOSCOPY (EGD);  Surgeon: Toledo, Boykin Nearing, MD;  Location: [facility] ENDOSCOPY;  Service: Gastroenterology;  Laterality: N/A;    SOCIAL HISTORY: Social History   Socioeconomic History   Marital status: Widowed    Spouse name: Not on file   Number of children: Not on file   Years of education: Not on  file   Highest education level: Not on file  Occupational History   Not on file  Tobacco Use   Smoking status: Never   Smokeless tobacco: Never  Vaping Use   Vaping status: Never Used  Substance and Sexual Activity   Alcohol use: Never   Drug use: Never   Sexual activity: Not on file  Other Topics Concern   Not on file  Social History Narrative   Not on file   Social Determinants of Health   Financial Resource Strain: Low Risk  (12/07/2021)   Received from [facility], [facility]   Overall Financial Resource Strain (CARDIA)    Difficulty of Paying Living Expenses: Not hard at all  Food Insecurity: No Food Insecurity (12/07/2021)   Received from [facility], [facility]   Hunger Vital Sign    Worried About Running Out of Food in the Last Year: Never true    Ran Out of Food in the Last Year: Never true  Transportation Needs: Unknown (06/27/2019)   Received from [facility], [facility]   [facility] - Transportation    In the past 12 months, has lack of transportation kept you from medical appointments or from getting medications?: No    Lack of Transportation (Non-Medical): Not on file  Physical  Activity: Inactive (12/07/2021)   Received from [facility], [facility]   Exercise Vital Sign    Days of Exercise per Week: 0 days    Minutes of Exercise per Session: 0 min  Stress: No Stress Concern Present (02/14/2022)   Received from [facility], [facility] - Occupational Stress Questionnaire    Feeling of Stress : Not at all  Social Connections: Unknown (12/15/2022)   Received from [facility]   Social Network    Social Network: Not on file  Intimate Partner Violence: Unknown (12/15/2022)   Received from [facility]   HITS    Physically Hurt: Not on file    Insult or Talk Down To: Not on file    Threaten Physical Harm: Not on file    Scream or Curse: Not on file    FAMILY  HISTORY: Family History  Problem Relation Age of Onset   Parkinson's disease Mother    Heart disease Father    Stomach cancer Maternal Uncle     ALLERGIES:  is allergic to amoxicillin-pot clavulanate, ampicillin, other, shellfish allergy, and simvastatin.  MEDICATIONS:  Current Outpatient Medications  Medication Sig Dispense Refill   acetaminophen (TYLENOL) 500 MG tablet Take 1,000 mg by mouth every 6 (six) hours as needed for moderate pain or headache.      alendronate (FOSAMAX) 70 MG tablet Take 70 mg by mouth once a week. Take with a full glass of water on an empty stomach.     Ascorbic Acid (VITAMIN C) 1000 MG tablet Take 1,000 mg by mouth daily after supper.      aspirin EC 81 MG tablet Take 81 mg by mouth daily. Swallow whole.     chlorthalidone (HYGROTON) 25 MG tablet Take 12.5 mg by mouth every Monday, Wednesday, and Friday.      cholecalciferol (VITAMIN D) 1000 units tablet Take 1,000 Units by mouth daily after supper.      ezetimibe (ZETIA) 10 MG tablet Take 10 mg by mouth daily.     ferrous sulfate 325 (65 FE) MG EC tablet Take 1 tablet (325 mg total) by mouth daily. 90 tablet 1   fluocinonide cream (LIDEX) 0.05 % Apply 1 application topically 3 (three) times daily as needed (for itching).      gabapentin (NEURONTIN) 100 MG capsule Take 100 mg by mouth daily.     Glucosamine-Chondroitin (OSTEO BI-FLEX REGULAR STRENGTH PO) Take 1 tablet by mouth daily after supper.     insulin aspart (NOVOLOG) 100 UNIT/ML injection Inject into the skin daily. Via insulin pump     Insulin Human (INSULIN PUMP) SOLN Inject into the skin. insulin aspart (NOVOLOG) 100 unit/ml     losartan (COZAAR) 50 MG tablet Take 50 mg by mouth daily.      metoprolol succinate (TOPROL-XL) 50 MG 24 hr tablet Take 25 mg by mouth 2 (two) times daily.      Multiple Vitamin (MULTIVITAMIN WITH MINERALS) TABS tablet Take 1 tablet by mouth daily. Multivitamin for Women 50+     NATURAL PSYLLIUM SEED PO Take 2 capsules by  mouth daily at 3 pm.      pantoprazole (PROTONIX) 40 MG tablet Take 40 mg by mouth daily before breakfast.      Probiotic Product (PROBIOTIC DAILY PO) Take 1 capsule by mouth daily after supper.     No current facility-administered medications for this visit.     PHYSICAL EXAMINATION: ECOG PERFORMANCE STATUS: 1 - Symptomatic but  completely ambulatory Vitals:   05/05/23 1317  BP: (!) 144/52  Pulse: (!) 105  Resp: 18  Temp: 98.3 F (36.8 C)  SpO2: 96%   Filed Weights   05/05/23 1317  Weight: 152 lb 1.6 oz (69 kg)    Physical Exam HENT:     Head: Normocephalic and atraumatic.  Eyes:     General: No scleral icterus. Cardiovascular:     Rate and Rhythm: Normal rate.     Heart sounds: Normal heart sounds.  Pulmonary:     Effort: Pulmonary effort is normal. No respiratory distress.     Breath sounds: No wheezing.  Abdominal:     General: Bowel sounds are normal. There is no distension.     Palpations: Abdomen is soft.  Musculoskeletal:        General: No deformity. Normal range of motion.     Cervical back: Normal range of motion and neck supple.  Skin:    General: Skin is warm and dry.     Findings: No erythema or rash.  Neurological:     Mental Status: She is alert and oriented to person, place, and time. Mental status is at baseline.     Cranial Nerves: No cranial nerve deficit.     Coordination: Coordination normal.  Psychiatric:        Mood and Affect: Mood normal.     LABORATORY DATA:  I have reviewed the data as listed    Latest Ref Rng & Units 04/29/2023    1:12 PM 04/08/2023    6:54 AM 11/06/2022    1:52 PM  CBC  WBC 4.0 - 10.5 K/uL 14.6  17.9  16.2   Hemoglobin 12.0 - 15.0 g/dL 16.1  09.6  04.5   Hematocrit 36.0 - 46.0 % 31.1  33.0  32.3   Platelets 150 - 400 K/uL 272  454  226       Latest Ref Rng & Units 04/29/2023    1:12 PM 04/08/2023    6:54 AM 11/06/2022    1:52 PM  CMP  Glucose 70 - 99 mg/dL 409  811  81   BUN 8 - 23 mg/dL 21  16  C 20    Creatinine 0.44 - 1.00 mg/dL 9.14  7.82  C 9.56   Sodium 135 - 145 mmol/L 130  131  134   Potassium 3.5 - 5.1 mmol/L 4.3  4.3  4.2   Chloride 98 - 111 mmol/L 100  98  101   CO2 22 - 32 mmol/L 23  23  25    Calcium 8.9 - 10.3 mg/dL 9.2  9.1  9.0   Total Protein 6.5 - 8.1 g/dL 6.4  6.7  C 6.3   Total Bilirubin 0.3 - 1.2 mg/dL 1.2  0.7  C 1.1   Alkaline Phos 38 - 126 U/L 74  82  C 59   AST 15 - 41 U/L 25  29  C 33   ALT 0 - 44 U/L 25  39  C 27     C Corrected result     Iron/TIBC/Ferritin/ %Sat    Component Value Date/Time   IRON 56 04/29/2023 1312   TIBC 374 04/29/2023 1312   FERRITIN 41 04/29/2023 1312   IRONPCTSAT 15 04/29/2023 1312      RADIOGRAPHIC STUDIES: I have personally reviewed the radiological images as listed and agreed with the findings in the report. DG Chest 2 View  Result Date: 04/08/2023 CLINICAL DATA:  89 year old female  woken by chest pain at 0300 hours. EXAM: CHEST - 2 VIEW COMPARISON:  Chest radiographs 11/16/2017 and earlier. FINDINGS: Seated AP and lateral views of the chest at 0658 hours. Chronic sternotomy. Mild chronic cardiomegaly appears stable. Lower lung volumes with bibasilar atelectasis. No superimposed pneumothorax, pulmonary edema or pleural effusion. No consolidation identified. No acute osseous abnormality identified. Negative visible bowel gas. IMPRESSION: Low lung volumes, otherwise no acute cardiopulmonary abnormality. Chronic sternotomy and mild cardiomegaly. Electronically Signed   By: Odessa Fleming M.D.   On: 04/08/2023 07:08

## 2023-05-05 NOTE — Assessment & Plan Note (Addendum)
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  No consitutional symptoms, continue watchful waiting.    IGVH test was not successfl due to low B cell clonal band.   Labs reviewed and discussed with patient.  Stable total white count.  No B symptoms. Hemoglobin is worse, unknown etiology. Repeat in 2 months.  Continue watchful waiting.

## 2023-05-15 ENCOUNTER — Other Ambulatory Visit: Payer: Self-pay

## 2023-05-15 DIAGNOSIS — C9110 Chronic lymphocytic leukemia of B-cell type not having achieved remission: Secondary | ICD-10-CM

## 2023-05-15 DIAGNOSIS — D472 Monoclonal gammopathy: Secondary | ICD-10-CM

## 2023-07-07 ENCOUNTER — Inpatient Hospital Stay: Payer: Medicare HMO | Attending: Oncology

## 2023-07-07 DIAGNOSIS — D472 Monoclonal gammopathy: Secondary | ICD-10-CM | POA: Insufficient documentation

## 2023-07-07 DIAGNOSIS — C9110 Chronic lymphocytic leukemia of B-cell type not having achieved remission: Secondary | ICD-10-CM | POA: Insufficient documentation

## 2023-07-07 DIAGNOSIS — Z7982 Long term (current) use of aspirin: Secondary | ICD-10-CM | POA: Diagnosis not present

## 2023-07-07 DIAGNOSIS — D509 Iron deficiency anemia, unspecified: Secondary | ICD-10-CM | POA: Insufficient documentation

## 2023-07-07 DIAGNOSIS — Z79899 Other long term (current) drug therapy: Secondary | ICD-10-CM | POA: Diagnosis not present

## 2023-07-07 LAB — CMP (CANCER CENTER ONLY)
ALT: 25 U/L (ref 0–44)
AST: 26 U/L (ref 15–41)
Albumin: 4.1 g/dL (ref 3.5–5.0)
Alkaline Phosphatase: 54 U/L (ref 38–126)
Anion gap: 7 (ref 5–15)
BUN: 23 mg/dL (ref 8–23)
CO2: 25 mmol/L (ref 22–32)
Calcium: 9.2 mg/dL (ref 8.9–10.3)
Chloride: 99 mmol/L (ref 98–111)
Creatinine: 0.83 mg/dL (ref 0.44–1.00)
GFR, Estimated: >60 mL/min (ref >60)
Glucose, Bld: 84 mg/dL (ref 70–99)
Potassium: 4.1 mmol/L (ref 3.5–5.1)
Sodium: 131 mmol/L — ABNORMAL LOW (ref 135–145)
Total Bilirubin: 1.1 mg/dL (ref <1.2)
Total Protein: 6.1 g/dL — ABNORMAL LOW (ref 6.5–8.1)

## 2023-07-07 LAB — IRON AND TIBC
Iron: 89 ug/dL (ref 28–170)
Saturation Ratios: 22 % (ref 10.4–31.8)
TIBC: 409 ug/dL (ref 250–450)
UIBC: 320 ug/dL

## 2023-07-07 LAB — CBC WITH DIFFERENTIAL (CANCER CENTER ONLY)
Abs Immature Granulocytes: 0.09 10*3/uL — ABNORMAL HIGH (ref 0.00–0.07)
Basophils Absolute: 0.1 10*3/uL (ref 0.0–0.1)
Basophils Relative: 1 %
Eosinophils Absolute: 0.2 10*3/uL (ref 0.0–0.5)
Eosinophils Relative: 2 %
HCT: 30.9 % — ABNORMAL LOW (ref 36.0–46.0)
Hemoglobin: 10.5 g/dL — ABNORMAL LOW (ref 12.0–15.0)
Immature Granulocytes: 1 %
Lymphocytes Relative: 66 %
Lymphs Abs: 9.7 10*3/uL — ABNORMAL HIGH (ref 0.7–4.0)
MCH: 29.2 pg (ref 26.0–34.0)
MCHC: 34 g/dL (ref 30.0–36.0)
MCV: 86.1 fL (ref 80.0–100.0)
Monocytes Absolute: 0.7 10*3/uL (ref 0.1–1.0)
Monocytes Relative: 5 %
Neutro Abs: 3.6 10*3/uL (ref 1.7–7.7)
Neutrophils Relative %: 25 %
Platelet Count: 250 10*3/uL (ref 150–400)
RBC: 3.59 MIL/uL — ABNORMAL LOW (ref 3.87–5.11)
RDW: 13.7 % (ref 11.5–15.5)
Smear Review: NORMAL
WBC Count: 14.3 10*3/uL — ABNORMAL HIGH (ref 4.0–10.5)
nRBC: 0 % (ref 0.0–0.2)

## 2023-07-07 LAB — FOLATE: Folate: 33 ng/mL (ref >5.9)

## 2023-07-07 LAB — FERRITIN: Ferritin: 21 ng/mL (ref 11–307)

## 2023-07-07 LAB — TSH: TSH: 2.997 u[IU]/mL (ref 0.350–4.500)

## 2023-07-07 LAB — VITAMIN B12: Vitamin B-12: 1088 pg/mL — ABNORMAL HIGH (ref 180–914)

## 2023-07-08 LAB — KAPPA/LAMBDA LIGHT CHAINS
Kappa free light chain: 48.6 mg/L — ABNORMAL HIGH (ref 3.3–19.4)
Kappa, lambda light chain ratio: 5.79 — ABNORMAL HIGH (ref 0.26–1.65)
Lambda free light chains: 8.4 mg/L (ref 5.7–26.3)

## 2023-07-09 ENCOUNTER — Inpatient Hospital Stay: Payer: Medicare HMO | Admitting: Oncology

## 2023-07-09 ENCOUNTER — Encounter: Payer: Self-pay | Admitting: Oncology

## 2023-07-09 VITALS — BP 140/65 | HR 51 | Temp 97.3°F | Resp 18 | Wt 153.1 lb

## 2023-07-09 DIAGNOSIS — D508 Other iron deficiency anemias: Secondary | ICD-10-CM | POA: Diagnosis not present

## 2023-07-09 DIAGNOSIS — D472 Monoclonal gammopathy: Secondary | ICD-10-CM

## 2023-07-09 DIAGNOSIS — C9110 Chronic lymphocytic leukemia of B-cell type not having achieved remission: Secondary | ICD-10-CM

## 2023-07-09 NOTE — Assessment & Plan Note (Signed)
#  IgM MGUS, Multiple myeloma panel showed IgM monoclonal protein with kappa light chain specificity.  M protein not observed. Light chain ratio is stable/improved.   Continue observation

## 2023-07-09 NOTE — Assessment & Plan Note (Addendum)
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  No consitutional symptoms, continue watchful waiting.    IGVH test was not successfl due to low B cell clonal band.   Labs reviewed and discussed with patient.  Stable total white count.  No B symptoms. Hemoglobin is stable, no further decrease Continue watchful waiting.

## 2023-07-09 NOTE — Progress Notes (Signed)
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  No consitutional symptoms, continue watchful waiting.    IGVH test was not successfl due to low B cell clonal band.   Labs reviewed and discussed with patient.  Stable total white count.  No B symptoms. Hemoglobin is stable, no further decrease Continue watchful waiting.  MGUS (monoclonal gammopathy of unknown significance) #IgM MGUS, Multiple myeloma panel showed IgM monoclonal protein with kappa light chain specificity.  M protein not observed. Light chain ratio is stable/improved.   Continue observation  IDA (iron deficiency anemia) Lab Results  Component Value Date   HGB 10.5 (L) 07/07/2023   TIBC 409 07/07/2023   IRONPCTSAT 22 07/07/2023   FERRITIN 21 07/07/2023    Recommend patient take oral iron supplementation to daily Decrease vitamin b12 to once per week   All questions were answered. The patient knows to call the clinic with any problems, questions or concerns.  Cassandra Patience, MD, PhD [facility] Hematology Oncology 07/09/2023   HISTORY OF PRESENTING ILLNESS:   Cassandra Gentry is a  89 y.o.  female with PMH listed below was seen in consultation at the request of  Cassandra Nurse, MD  for evaluation of CLL and anemia  Reports a diagnosis of CLL and she previously follows up with [facility] oncology.  She participated in a clinical trial.  Patient lost to follow-up since the start of COVID-19 pandemic. Patient denies ever being on any treatment for CLL.  Denies any constitutional symptoms 05/04/2020, CBC showed hemoglobin 8.6, hematocrit 27, elevated white count 22.8, MCV 75.6, Patient was referred to hematology to establish care . She follows up with endocrinology for type 1 diabetes and was  seen by Cassandra Gentry on 09/12/2020.  Denies any change of bowel habit, bloody or black stool.  She reports intermittent rectal bleeding which she attributes to hemorrhoid bleeding.  # I obtained and reviewed oncology medical records at [facility]. Last seen by Cassandra Gentry on 12/01/2017. a. Incidentally noted to have a mild leukocytosis on a routine annual examination, 11/09.  b. Initial hematologic evaluation 07/22/08 shows WBC 13.5, HGB 12.5, PLT 214.  Peripheral blood flow cytometry shows CD5, CD19, CD23, kappa restricted monoclonal B-cell population comprising 38% of peripheral blood events, CD38 negative, consistent with a diagnosis of chronic lymphocytic leukemia.   c. Initial evaluation at [facility] 09/27/08 shows a WBC 13.8, peripheral blood interphase cytogenetics reveals deletion 13q14.3 in 81/200 cells and an immunoglobulin heavy chain translocation with an unknown translocation partner in 165/200 cells.  10/02/2021, EGD and colonoscopy was performed.  Patient has a nonmalignant polyp which was removed.  INTERVAL HISTORY Cassandra Gentry is a 89 y.o. female who has above history reviewed by me today presents for follow up visit for CLL, iron deficiency anemia, MGUS Her husband passed away in 09/04/2022. She is coping well. Patient takes oral iron supplementation on and off.  Chronic fatigue, unchanged. Weight is stable.   :Review of Systems  Constitutional:  Positive for fatigue. Negative for appetite change, chills and fever.  HENT:   Negative for hearing loss and voice change.   Eyes:  Negative for eye problems.  Respiratory:  Negative for chest tightness and cough.   Cardiovascular:  Negative for chest pain.  Gastrointestinal:  Negative for abdominal distention,  abdominal pain and blood in stool.  Endocrine: Negative for hot flashes.  Genitourinary:  Negative for difficulty urinating and frequency.   Musculoskeletal:  Negative for arthralgias.  Skin:  Negative for itching and rash.   Neurological:  Negative for extremity weakness.  Hematological:  Negative for adenopathy.  Psychiatric/Behavioral:  Negative for confusion.     MEDICAL HISTORY:  Past Medical History:  Diagnosis Date   Chronic lymphocytic leukemia (HCC)    Complication of anesthesia    SICK X 3 DAYS AFTER LAST CATARACT. LM FOR HER TO CALL BACK WITH MORE INFO   Coronary artery disease    Diabetes mellitus without complication (HCC)    Diverticulosis    Dysrhythmia    Edema    LEFT LEG   GERD (gastroesophageal reflux disease)    Hyperlipidemia    Hypertension    Thyroid disease     SURGICAL HISTORY: Past Surgical History:  Procedure Laterality Date   CATARACT EXTRACTION W/PHACO Left 06/08/2018   Procedure: CATARACT EXTRACTION PHACO AND INTRAOCULAR LENS PLACEMENT (IOC);  Surgeon: Cassandra Manila, MD;  Location: [facility] ORS;  Service: Ophthalmology;  Laterality: Left;  [place] 02:20 CDE 23.41 Fluid pack lot # [id number]   CATARACT EXTRACTION W/PHACO Right 07/13/2018   Procedure: CATARACT EXTRACTION PHACO AND INTRAOCULAR LENS PLACEMENT (IOC);  Surgeon: Cassandra Manila, MD;  Location: [facility] ORS;  Service: Ophthalmology;  Laterality: Right;  [place] 01:18.2 CDE 15.42 Fluid Pack Lot # [id number]   COLONOSCOPY WITH PROPOFOL N/A 10/02/2021   Procedure: COLONOSCOPY WITH PROPOFOL;  Surgeon: Toledo, Boykin Nearing, MD;  Location: [facility] ENDOSCOPY;  Service: Gastroenterology;  Laterality: N/A;  IDDM   CORONARY ARTERY BYPASS GRAFT  2000   ESOPHAGOGASTRODUODENOSCOPY N/A 10/02/2021   Procedure: ESOPHAGOGASTRODUODENOSCOPY (EGD);  Surgeon: Toledo, Boykin Nearing, MD;  Location: [facility] ENDOSCOPY;  Service: Gastroenterology;  Laterality: N/A;    SOCIAL HISTORY: Social History   Socioeconomic History   Marital status: Widowed    Spouse name: Not on file   Number of children: Not on file   Years of education: Not on file   Highest education level: Not on file  Occupational History   Not on file  Tobacco Use   Smoking status: Never    Smokeless tobacco: Never  Vaping Use   Vaping status: Never Used  Substance and Sexual Activity   Alcohol use: Never   Drug use: Never   Sexual activity: Not on file  Other Topics Concern   Not on file  Social History Narrative   Not on file   Social Determinants of Health   Financial Resource Strain: Low Risk  (12/07/2021)   Received from [facility], [facility]   Overall Financial Resource Strain (CARDIA)    Difficulty of Paying Living Expenses: Not hard at all  Food Insecurity: No Food Insecurity (12/07/2021)   Received from [facility], [facility]   Hunger Vital Sign    Worried About Running Out of Food in the Last Year: Never true    Ran Out of Food in the Last Year: Never true  Transportation Needs: Unknown (06/27/2019)   Received from [facility], [facility]   [facility] - Transportation    In the past 12 months, has lack of transportation kept you from medical appointments or from getting medications?: No    Lack of Transportation (Non-Medical): Not on file  Physical Activity: Inactive (12/07/2021)   Received from [facility], [facility]   Exercise Vital Sign    Days of Exercise  per Week: 0 days    Minutes of Exercise per Session: 0 min  Stress: No Stress Concern Present (02/14/2022)   Received from [facility], [facility] - Occupational Stress Questionnaire    Feeling of Stress : Not at all  Social Connections: Unknown (12/15/2022)   Received from [facility]   Social Network    Social Network: Not on file  Intimate Partner Violence: Unknown (12/15/2022)   Received from [facility]   HITS    Physically Hurt: Not on file    Insult or Talk Down To: Not on file    Threaten Physical Harm: Not on file    Scream or Curse: Not on file    FAMILY HISTORY: Family History  Problem Relation Age of Onset   Parkinson's disease Mother    Heart disease Father    Stomach cancer  Maternal Uncle     ALLERGIES:  is allergic to amoxicillin-pot clavulanate, ampicillin, other, shellfish allergy, and simvastatin.  MEDICATIONS:  Current Outpatient Medications  Medication Sig Dispense Refill   acetaminophen (TYLENOL) 500 MG tablet Take 1,000 mg by mouth every 6 (six) hours as needed for moderate pain or headache.      alendronate (FOSAMAX) 70 MG tablet Take 70 mg by mouth once a week. Take with a full glass of water on an empty stomach.     Ascorbic Acid (VITAMIN C) 1000 MG tablet Take 1,000 mg by mouth daily after supper.      aspirin EC 81 MG tablet Take 81 mg by mouth daily. Swallow whole.     chlorthalidone (HYGROTON) 25 MG tablet Take 12.5 mg by mouth every Monday, Wednesday, and Friday.      cholecalciferol (VITAMIN D) 1000 units tablet Take 1,000 Units by mouth daily after supper.      ezetimibe (ZETIA) 10 MG tablet Take 10 mg by mouth daily.     ferrous sulfate 325 (65 FE) MG EC tablet Take 1 tablet (325 mg total) by mouth daily. 90 tablet 1   fluocinonide cream (LIDEX) 0.05 % Apply 1 application topically 3 (three) times daily as needed (for itching).      gabapentin (NEURONTIN) 100 MG capsule Take 100 mg by mouth daily.     Glucosamine-Chondroitin (OSTEO BI-FLEX REGULAR STRENGTH PO) Take 1 tablet by mouth daily after supper.     insulin aspart (NOVOLOG) 100 UNIT/ML injection Inject into the skin daily. Via insulin pump     Insulin Human (INSULIN PUMP) SOLN Inject into the skin. insulin aspart (NOVOLOG) 100 unit/ml     losartan (COZAAR) 50 MG tablet Take 50 mg by mouth daily.      metoprolol succinate (TOPROL-XL) 50 MG 24 hr tablet Take 25 mg by mouth 2 (two) times daily.      Multiple Vitamin (MULTIVITAMIN WITH MINERALS) TABS tablet Take 1 tablet by mouth daily. Multivitamin for Women 50+     NATURAL PSYLLIUM SEED PO Take 2 capsules by mouth daily at 3 pm.      pantoprazole (PROTONIX) 40 MG tablet Take 40 mg by mouth daily before breakfast.      Probiotic Product  (PROBIOTIC DAILY PO) Take 1 capsule by mouth daily after supper.     No current facility-administered medications for this visit.     PHYSICAL EXAMINATION: ECOG PERFORMANCE STATUS: 1 - Symptomatic but completely ambulatory Vitals:   07/09/23 1452  BP: (!) 140/65  Pulse: (!) 51  Resp: 18  Temp: (!) 97.3  F (36.3 C)   Filed Weights   07/09/23 1452  Weight: 153 lb 1.6 oz (69.4 kg)    Physical Exam HENT:     Head: Normocephalic and atraumatic.  Eyes:     General: No scleral icterus. Cardiovascular:     Rate and Rhythm: Normal rate.     Heart sounds: Normal heart sounds.  Pulmonary:     Effort: Pulmonary effort is normal. No respiratory distress.     Breath sounds: No wheezing.  Abdominal:     General: Bowel sounds are normal. There is no distension.     Palpations: Abdomen is soft.  Musculoskeletal:        General: No deformity. Normal range of motion.     Cervical back: Normal range of motion and neck supple.  Skin:    General: Skin is warm and dry.     Findings: No erythema or rash.  Neurological:     Mental Status: She is alert and oriented to person, place, and time. Mental status is at baseline.     Cranial Nerves: No cranial nerve deficit.     Coordination: Coordination normal.  Psychiatric:        Mood and Affect: Mood normal.     LABORATORY DATA:  I have reviewed the data as listed    Latest Ref Rng & Units 07/07/2023    1:51 PM 04/29/2023    1:12 PM 04/08/2023    6:54 AM  CBC  WBC 4.0 - 10.5 K/uL 14.3  14.6  17.9   Hemoglobin 12.0 - 15.0 g/dL 84.6  96.2  95.2   Hematocrit 36.0 - 46.0 % 30.9  31.1  33.0   Platelets 150 - 400 K/uL 250  272  454       Latest Ref Rng & Units 07/07/2023    1:51 PM 04/29/2023    1:12 PM 04/08/2023    6:54 AM  CMP  Glucose 70 - 99 mg/dL 84  841  324   BUN 8 - 23 mg/dL 23  21  16   C  Creatinine 0.44 - 1.00 mg/dL 4.01  0.27  2.53  C  Sodium 135 - 145 mmol/L 131  130  131   Potassium 3.5 - 5.1 mmol/L 4.1  4.3  4.3    Chloride 98 - 111 mmol/L 99  100  98   CO2 22 - 32 mmol/L 25  23  23    Calcium 8.9 - 10.3 mg/dL 9.2  9.2  9.1   Total Protein 6.5 - 8.1 g/dL 6.1  6.4  6.7  C  Total Bilirubin <1.2 mg/dL 1.1  1.2  0.7  C  Alkaline Phos 38 - 126 U/L 54  74  82  C  AST 15 - 41 U/L 26  25  29   C  ALT 0 - 44 U/L 25  25  39  C    C Corrected result     Iron/TIBC/Ferritin/ %Sat    Component Value Date/Time   IRON 89 07/07/2023 1351   TIBC 409 07/07/2023 1351   FERRITIN 21 07/07/2023 1351   IRONPCTSAT 22 07/07/2023 1351      RADIOGRAPHIC STUDIES: I have personally reviewed the radiological images as listed and agreed with the findings in the report. No results found.

## 2023-07-09 NOTE — Assessment & Plan Note (Addendum)
Lab Results  Component Value Date   HGB 10.5 (L) 07/07/2023   TIBC 409 07/07/2023   IRONPCTSAT 22 07/07/2023   FERRITIN 21 07/07/2023    Recommend patient take oral iron supplementation to daily Decrease vitamin b12 to once per week

## 2024-01-06 ENCOUNTER — Inpatient Hospital Stay: Payer: Medicare HMO | Attending: Oncology

## 2024-01-06 DIAGNOSIS — E109 Type 1 diabetes mellitus without complications: Secondary | ICD-10-CM | POA: Diagnosis not present

## 2024-01-06 DIAGNOSIS — D472 Monoclonal gammopathy: Secondary | ICD-10-CM | POA: Diagnosis present

## 2024-01-06 DIAGNOSIS — Z79899 Other long term (current) drug therapy: Secondary | ICD-10-CM | POA: Diagnosis not present

## 2024-01-06 DIAGNOSIS — Z794 Long term (current) use of insulin: Secondary | ICD-10-CM | POA: Insufficient documentation

## 2024-01-06 DIAGNOSIS — C9110 Chronic lymphocytic leukemia of B-cell type not having achieved remission: Secondary | ICD-10-CM | POA: Insufficient documentation

## 2024-01-06 DIAGNOSIS — Z7982 Long term (current) use of aspirin: Secondary | ICD-10-CM | POA: Insufficient documentation

## 2024-01-06 DIAGNOSIS — D509 Iron deficiency anemia, unspecified: Secondary | ICD-10-CM | POA: Insufficient documentation

## 2024-01-06 LAB — CBC WITH DIFFERENTIAL (CANCER CENTER ONLY)
Abs Immature Granulocytes: 0.11 10*3/uL — ABNORMAL HIGH (ref 0.00–0.07)
Basophils Absolute: 0.1 10*3/uL (ref 0.0–0.1)
Basophils Relative: 1 %
Eosinophils Absolute: 0.4 10*3/uL (ref 0.0–0.5)
Eosinophils Relative: 2 %
HCT: 30.7 % — ABNORMAL LOW (ref 36.0–46.0)
Hemoglobin: 10.5 g/dL — ABNORMAL LOW (ref 12.0–15.0)
Immature Granulocytes: 1 %
Lymphocytes Relative: 68 %
Lymphs Abs: 10.9 10*3/uL — ABNORMAL HIGH (ref 0.7–4.0)
MCH: 29.2 pg (ref 26.0–34.0)
MCHC: 34.2 g/dL (ref 30.0–36.0)
MCV: 85.3 fL (ref 80.0–100.0)
Monocytes Absolute: 0.8 10*3/uL (ref 0.1–1.0)
Monocytes Relative: 5 %
Neutro Abs: 3.6 10*3/uL (ref 1.7–7.7)
Neutrophils Relative %: 23 %
Platelet Count: 238 10*3/uL (ref 150–400)
RBC: 3.6 MIL/uL — ABNORMAL LOW (ref 3.87–5.11)
RDW: 13.2 % (ref 11.5–15.5)
Smear Review: NORMAL
WBC Count: 15.9 10*3/uL — ABNORMAL HIGH (ref 4.0–10.5)
nRBC: 0 % (ref 0.0–0.2)

## 2024-01-06 LAB — CMP (CANCER CENTER ONLY)
ALT: 25 U/L (ref 0–44)
AST: 28 U/L (ref 15–41)
Albumin: 4 g/dL (ref 3.5–5.0)
Alkaline Phosphatase: 57 U/L (ref 38–126)
Anion gap: 9 (ref 5–15)
BUN: 22 mg/dL (ref 8–23)
CO2: 22 mmol/L (ref 22–32)
Calcium: 8.9 mg/dL (ref 8.9–10.3)
Chloride: 101 mmol/L (ref 98–111)
Creatinine: 0.75 mg/dL (ref 0.44–1.00)
GFR, Estimated: >60 mL/min (ref >60)
Glucose, Bld: 124 mg/dL — ABNORMAL HIGH (ref 70–99)
Potassium: 4.4 mmol/L (ref 3.5–5.1)
Sodium: 132 mmol/L — ABNORMAL LOW (ref 135–145)
Total Bilirubin: 0.9 mg/dL (ref 0.0–1.2)
Total Protein: 5.7 g/dL — ABNORMAL LOW (ref 6.5–8.1)

## 2024-01-06 LAB — IRON AND TIBC
Iron: 71 ug/dL (ref 28–170)
Saturation Ratios: 18 % (ref 10.4–31.8)
TIBC: 400 ug/dL (ref 250–450)
UIBC: 329 ug/dL

## 2024-01-06 LAB — RETIC PANEL
Immature Retic Fract: 7.2 % (ref 2.3–15.9)
RBC.: 3.57 MIL/uL — ABNORMAL LOW (ref 3.87–5.11)
Retic Count, Absolute: 63.9 10*3/uL (ref 19.0–186.0)
Retic Ct Pct: 1.8 % (ref 0.4–3.1)
Reticulocyte Hemoglobin: 33 pg (ref >27.9)

## 2024-01-06 LAB — FERRITIN: Ferritin: 19 ng/mL (ref 11–307)

## 2024-01-07 LAB — KAPPA/LAMBDA LIGHT CHAINS
Kappa free light chain: 42.5 mg/L — ABNORMAL HIGH (ref 3.3–19.4)
Kappa, lambda light chain ratio: 6.16 — ABNORMAL HIGH (ref 0.26–1.65)
Lambda free light chains: 6.9 mg/L (ref 5.7–26.3)

## 2024-01-14 ENCOUNTER — Inpatient Hospital Stay: Payer: Medicare HMO | Admitting: Oncology

## 2024-01-14 ENCOUNTER — Encounter: Payer: Self-pay | Admitting: Oncology

## 2024-01-14 VITALS — BP 146/46 | HR 120 | Temp 97.3°F | Resp 19 | Ht 65.0 in | Wt 162.6 lb

## 2024-01-14 DIAGNOSIS — D472 Monoclonal gammopathy: Secondary | ICD-10-CM | POA: Diagnosis not present

## 2024-01-14 DIAGNOSIS — D508 Other iron deficiency anemias: Secondary | ICD-10-CM

## 2024-01-14 DIAGNOSIS — C9110 Chronic lymphocytic leukemia of B-cell type not having achieved remission: Secondary | ICD-10-CM | POA: Diagnosis not present

## 2024-01-14 NOTE — Progress Notes (Signed)
 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  No consitutional symptoms, continue watchful waiting.    IGVH test was not successfl due to low B cell clonal band.   Labs reviewed and discussed with patient.  Stable total white count.  No B symptoms. Hemoglobin is stable, no further decrease Continue watchful waiting.  IDA (iron  deficiency anemia) Lab Results  Component Value Date   HGB 10.5 (L) 01/06/2024   TIBC 400 01/06/2024   IRONPCTSAT 18 01/06/2024   FERRITIN 19 01/06/2024    Recommend patient take oral iron  supplementation to daily Vitamin b12 1000mcg to once per week  MGUS (monoclonal gammopathy of unknown significance) #IgM MGUS, Lab Results  Component Value Date   MPROTEIN Not Observed 04/29/2023   KPAFRELGTCHN 42.5 (H) 01/06/2024   LAMBDASER 6.9 01/06/2024   KAPLAMBRATIO 6.16 (H) 01/06/2024    Light chain ratio is sightly increased.  Continue observation   All questions were answered. The patient knows to call the clinic with any problems, questions or concerns.  Cassandra Forbes, MD, PhD [facility] Hematology Oncology 01/14/2024   HISTORY OF PRESENTING ILLNESS:   Cassandra Gentry is a  89 y.o.  female with PMH listed below was seen in consultation at the request of  Little Riff, MD  for evaluation of CLL and anemia  Reports a diagnosis of CLL and she previously follows up with [facility] oncology.  She participated in a clinical trial.  Patient lost to follow-up since the start of COVID-19 pandemic. Patient denies ever being on any treatment for CLL.  Denies any constitutional symptoms 05/04/2020, CBC showed hemoglobin 8.6, hematocrit 27, elevated white count 22.8, MCV 75.6, Patient was referred to hematology to establish care . She  follows up with endocrinology for type 1 diabetes and was seen by Dr. Shelvy Dickens on 09/12/2020.  Denies any change of bowel habit, bloody or black stool.  She reports intermittent rectal bleeding which she attributes to hemorrhoid bleeding.  # I obtained and reviewed oncology medical records at [facility]. Last seen by Dr. Ltanya Rummer on 12/01/2017. a. Incidentally noted to have a mild leukocytosis on a routine annual examination, 11/09.  b. Initial hematologic evaluation 07/22/08 shows WBC 13.5, HGB 12.5, PLT 214.  Peripheral blood flow cytometry shows CD5, CD19, CD23, kappa restricted monoclonal B-cell population comprising 38% of peripheral blood events, CD38 negative, consistent with a diagnosis of chronic lymphocytic leukemia.   c. Initial evaluation at [facility] 09/27/08 shows a WBC 13.8, peripheral blood interphase cytogenetics reveals deletion 13q14.3 in 81/200 cells and an immunoglobulin heavy chain translocation with an unknown translocation partner in 165/200 cells.  10/02/2021, EGD and colonoscopy was performed.  Patient has a nonmalignant polyp which was removed.  INTERVAL HISTORY Cassandra Gentry is a 89 y.o. female who has above history reviewed by me today presents for follow up visit for CLL, iron  deficiency anemia, MGUS Chronic fatigue, unchanged. Weight is stable.   :Review of Systems  Constitutional:  Positive for fatigue. Negative for appetite change, chills and fever.  HENT:   Negative for hearing loss and voice change.   Eyes:  Negative for eye problems.  Respiratory:  Negative for chest tightness and cough.   Cardiovascular:  Negative for chest pain.  Gastrointestinal:  Negative for abdominal distention, abdominal pain and blood in stool.  Endocrine: Negative for hot flashes.  Genitourinary:  Negative for difficulty urinating and frequency.   Musculoskeletal:  Negative for arthralgias.  Skin:  Negative for itching and rash.  Neurological:  Negative for extremity weakness.  Hematological:   Negative for adenopathy.  Psychiatric/Behavioral:  Negative for confusion.     MEDICAL HISTORY:  Past Medical History:  Diagnosis Date   Chronic lymphocytic leukemia (HCC)    Complication of anesthesia    SICK X 3 DAYS AFTER LAST CATARACT. LM FOR HER TO CALL BACK WITH MORE INFO   Coronary artery disease    Diabetes mellitus without complication (HCC)    Diverticulosis    Dysrhythmia    Edema    LEFT LEG   GERD (gastroesophageal reflux disease)    Hyperlipidemia    Hypertension    Thyroid  disease     SURGICAL HISTORY: Past Surgical History:  Procedure Laterality Date   CATARACT EXTRACTION W/PHACO Left 06/08/2018   Procedure: CATARACT EXTRACTION PHACO AND INTRAOCULAR LENS PLACEMENT (IOC);  Surgeon: Clair Crews, MD;  Location: [facility] ORS;  Service: Ophthalmology;  Laterality: Left;  US  02:20 CDE 23.41 Fluid pack lot # [id number]   CATARACT EXTRACTION W/PHACO Right 07/13/2018   Procedure: CATARACT EXTRACTION PHACO AND INTRAOCULAR LENS PLACEMENT (IOC);  Surgeon: Clair Crews, MD;  Location: [facility] ORS;  Service: Ophthalmology;  Laterality: Right;  US  01:18.2 CDE 15.42 Fluid Pack Lot # [id number]   COLONOSCOPY WITH PROPOFOL  N/A 10/02/2021   Procedure: COLONOSCOPY WITH PROPOFOL ;  Surgeon: Toledo, Alphonsus Jeans, MD;  Location: [facility] ENDOSCOPY;  Service: Gastroenterology;  Laterality: N/A;  IDDM   CORONARY ARTERY BYPASS GRAFT  2000   ESOPHAGOGASTRODUODENOSCOPY N/A 10/02/2021   Procedure: ESOPHAGOGASTRODUODENOSCOPY (EGD);  Surgeon: Toledo, Alphonsus Jeans, MD;  Location: [facility] ENDOSCOPY;  Service: Gastroenterology;  Laterality: N/A;    SOCIAL HISTORY: Social History   Socioeconomic History   Marital status: Widowed    Spouse name: Not on file   Number of children: Not on file   Years of education: Not on file   Highest education level: Not on file  Occupational History   Not on file  Tobacco Use   Smoking status: Never   Smokeless tobacco: Never  Vaping Use   Vaping status: Never Used   Substance and Sexual Activity   Alcohol use: Never   Drug use: Never   Sexual activity: Not on file  Other Topics Concern   Not on file  Social History Narrative   Not on file   Social Drivers of Health   Financial Resource Strain: Low Risk  (12/07/2021)   Received from [facility], [facility]   Overall Financial Resource Strain (CARDIA)    Difficulty of Paying Living Expenses: Not hard at all  Food Insecurity: No Food Insecurity (12/07/2021)   Received from [facility], [facility]   Hunger Vital Sign    Worried About Running Out of Food in the Last Year: Never true    Ran Out of Food in the Last Year: Never true  Transportation Needs: Unknown (06/27/2019)   Received from [facility], [facility]   [facility] - Transportation    In the past 12 months, has lack of transportation kept you from medical appointments or from getting medications?: No    Lack of Transportation (Non-Medical): Not on file  Physical Activity: Inactive (12/07/2021)   Received from [facility], [facility]   Exercise Vital Sign    Days of Exercise per Week: 0 days  Minutes of Exercise per Session: 0 min  Stress: No Stress Concern Present (02/14/2022)   Received from [facility], [facility] - Occupational Stress Questionnaire    Feeling of Stress : Not at all  Social Connections: Unknown (12/15/2022)   Received from [facility]   Social Network    Social Network: Not on file  Intimate Partner Violence: Unknown (12/15/2022)   Received from [facility]   HITS    Physically Hurt: Not on file    Insult or Talk Down To: Not on file    Threaten Physical Harm: Not on file    Scream or Curse: Not on file    FAMILY HISTORY: Family History  Problem Relation Age of Onset   Parkinson's disease Mother    Heart disease Father    Stomach cancer Maternal Uncle     ALLERGIES:  is allergic to amoxicillin-pot  clavulanate, ampicillin, other, shellfish allergy, and simvastatin.  MEDICATIONS:  Current Outpatient Medications  Medication Sig Dispense Refill   acetaminophen  (TYLENOL ) 500 MG tablet Take 1,000 mg by mouth every 6 (six) hours as needed for moderate pain or headache.      alendronate (FOSAMAX) 70 MG tablet Take 70 mg by mouth once a week. Take with a full glass of water on an empty stomach.     Ascorbic Acid (VITAMIN C) 1000 MG tablet Take 1,000 mg by mouth daily after supper.      aspirin EC 81 MG tablet Take 81 mg by mouth daily. Swallow whole.     chlorthalidone (HYGROTON) 25 MG tablet Take 12.5 mg by mouth every Monday, Wednesday, and Friday.      cholecalciferol (VITAMIN D) 1000 units tablet Take 1,000 Units by mouth daily after supper.      ezetimibe (ZETIA) 10 MG tablet Take 10 mg by mouth daily.     ferrous sulfate  325 (65 FE) MG EC tablet Take 1 tablet (325 mg total) by mouth daily. 90 tablet 1   fluocinonide cream (LIDEX) 0.05 % Apply 1 application topically 3 (three) times daily as needed (for itching).      gabapentin (NEURONTIN) 100 MG capsule Take 100 mg by mouth daily.     Glucosamine-Chondroitin (OSTEO BI-FLEX REGULAR STRENGTH PO) Take 1 tablet by mouth daily after supper.     insulin aspart (NOVOLOG) 100 UNIT/ML injection Inject into the skin daily. Via insulin pump     Insulin Human (INSULIN PUMP) SOLN Inject into the skin. insulin aspart (NOVOLOG) 100 unit/ml     losartan (COZAAR) 50 MG tablet Take 50 mg by mouth daily.      metoprolol succinate (TOPROL-XL) 50 MG 24 hr tablet Take 25 mg by mouth 2 (two) times daily.      Multiple Vitamin (MULTIVITAMIN WITH MINERALS) TABS tablet Take 1 tablet by mouth daily. Multivitamin for Women 50+     NATURAL PSYLLIUM SEED PO Take 2 capsules by mouth daily at 3 pm.      pantoprazole (PROTONIX) 40 MG tablet Take 40 mg by mouth daily before breakfast.      Probiotic Product (PROBIOTIC DAILY PO) Take 1 capsule by mouth daily after supper.      No current facility-administered medications for this visit.     PHYSICAL EXAMINATION: ECOG PERFORMANCE STATUS: 1 - Symptomatic but completely ambulatory Vitals:   01/14/24 1416  BP: (!) 146/46  Pulse: (!) 120  Resp: 19  Temp: (!) 97.3 F (36.3 C)  SpO2: 97%  Filed Weights   01/14/24 1416  Weight: 162 lb 9.6 oz (73.8 kg)    Physical Exam HENT:     Head: Normocephalic and atraumatic.  Eyes:     General: No scleral icterus. Cardiovascular:     Rate and Rhythm: Normal rate.     Heart sounds: Normal heart sounds.  Pulmonary:     Effort: Pulmonary effort is normal. No respiratory distress.     Breath sounds: No wheezing.  Abdominal:     General: Bowel sounds are normal. There is no distension.     Palpations: Abdomen is soft.  Musculoskeletal:        General: No deformity. Normal range of motion.     Cervical back: Normal range of motion and neck supple.  Skin:    General: Skin is warm and dry.     Findings: No erythema or rash.  Neurological:     Mental Status: She is alert and oriented to person, place, and time. Mental status is at baseline.     Cranial Nerves: No cranial nerve deficit.     Coordination: Coordination normal.  Psychiatric:        Mood and Affect: Mood normal.     LABORATORY DATA:  I have reviewed the data as listed    Latest Ref Rng & Units 01/06/2024    1:53 PM 07/07/2023    1:51 PM 04/29/2023    1:12 PM  CBC  WBC 4.0 - 10.5 K/uL 15.9  14.3  14.6   Hemoglobin 12.0 - 15.0 g/dL 69.6  29.5  28.4   Hematocrit 36.0 - 46.0 % 30.7  30.9  31.1   Platelets 150 - 400 K/uL 238  250  272       Latest Ref Rng & Units 01/06/2024    1:53 PM 07/07/2023    1:51 PM 04/29/2023    1:12 PM  CMP  Glucose 70 - 99 mg/dL 132  84  440   BUN 8 - 23 mg/dL 22  23  21    Creatinine 0.44 - 1.00 mg/dL 1.02  7.25  3.66   Sodium 135 - 145 mmol/L 132  131  130   Potassium 3.5 - 5.1 mmol/L 4.4  4.1  4.3   Chloride 98 - 111 mmol/L 101  99  100   CO2 22 - 32 mmol/L 22   25  23    Calcium 8.9 - 10.3 mg/dL 8.9  9.2  9.2   Total Protein 6.5 - 8.1 g/dL 5.7  6.1  6.4   Total Bilirubin 0.0 - 1.2 mg/dL 0.9  1.1  1.2   Alkaline Phos 38 - 126 U/L 57  54  74   AST 15 - 41 U/L 28  26  25    ALT 0 - 44 U/L 25  25  25       Iron /TIBC/Ferritin/ %Sat    Component Value Date/Time   IRON  71 01/06/2024 1353   TIBC 400 01/06/2024 1353   FERRITIN 19 01/06/2024 1353   IRONPCTSAT 18 01/06/2024 1353      RADIOGRAPHIC STUDIES: I have personally reviewed the radiological images as listed and agreed with the findings in the report. No results found.

## 2024-01-14 NOTE — Assessment & Plan Note (Signed)
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  No consitutional symptoms, continue watchful waiting.    IGVH test was not successfl due to low B cell clonal band.   Labs reviewed and discussed with patient.  Stable total white count.  No B symptoms. Hemoglobin is stable, no further decrease Continue watchful waiting.

## 2024-01-14 NOTE — Assessment & Plan Note (Addendum)
#  IgM MGUS, Lab Results  Component Value Date   MPROTEIN Not Observed 04/29/2023   KPAFRELGTCHN 42.5 (H) 01/06/2024   LAMBDASER 6.9 01/06/2024   KAPLAMBRATIO 6.16 (H) 01/06/2024    Light chain ratio is sightly increased.  Continue observation

## 2024-01-14 NOTE — Assessment & Plan Note (Addendum)
 Lab Results  Component Value Date   HGB 10.5 (L) 01/06/2024   TIBC 400 01/06/2024   IRONPCTSAT 18 01/06/2024   FERRITIN 19 01/06/2024    Recommend patient take oral iron  supplementation to daily Vitamin b12 1000mcg to once per week

## 2024-05-19 ENCOUNTER — Other Ambulatory Visit: Payer: Self-pay | Admitting: Orthopedic Surgery

## 2024-05-25 ENCOUNTER — Encounter
Admission: RE | Admit: 2024-05-25 | Discharge: 2024-05-25 | Disposition: A | Discharge status: Home / Self Care | Source: Ambulatory Visit | Attending: Orthopedic Surgery | Admitting: Orthopedic Surgery

## 2024-05-25 ENCOUNTER — Other Ambulatory Visit: Payer: Self-pay

## 2024-05-25 VITALS — BP 146/53 | HR 59 | Resp 16 | Wt 164.7 lb

## 2024-05-25 DIAGNOSIS — Z794 Long term (current) use of insulin: Secondary | ICD-10-CM | POA: Insufficient documentation

## 2024-05-25 DIAGNOSIS — E119 Type 2 diabetes mellitus without complications: Secondary | ICD-10-CM | POA: Insufficient documentation

## 2024-05-25 DIAGNOSIS — Z01818 Encounter for other preprocedural examination: Secondary | ICD-10-CM | POA: Diagnosis present

## 2024-05-25 DIAGNOSIS — Z01812 Encounter for preprocedural laboratory examination: Secondary | ICD-10-CM

## 2024-05-25 HISTORY — DX: Anemia, unspecified: D64.9

## 2024-05-25 HISTORY — DX: Unspecified osteoarthritis, unspecified site: M19.90

## 2024-05-25 LAB — SURGICAL PCR SCREEN
MRSA, PCR: NEGATIVE
Staphylococcus aureus: NEGATIVE

## 2024-05-25 NOTE — Patient Instructions (Addendum)
 Your procedure is scheduled on: 06/02/24 - Thursday Report to the Registration Desk on the 1st floor of the [facility]. To find out your arrival time, please call [phone] between 1PM - 3PM on: 06/01/24 - Wednesday If your arrival time is 6:00 am, do not arrive before that time as the [facility] entrance doors do not open until 6:00 am.  REMEMBER: Instructions that are not followed completely may result in serious medical risk, up to and including death; or upon the discretion of your surgeon and anesthesiologist your surgery may need to be rescheduled.  Do not eat food after midnight the night before surgery.  No gum chewing or hard candies.  You may however, drink CLEAR liquids up to 2 hours before you are scheduled to arrive for your surgery. Do not drink anything within 2 hours of your scheduled arrival time.  Clear liquids include: - water   In addition, your doctor has ordered for you to drink the provided:  Gatorade G2 Drinking this carbohydrate drink up to two hours before surgery helps to reduce insulin resistance and improve patient outcomes. Please complete drinking 2 hours before scheduled arrival time.  One week prior to surgery: Stop Anti-inflammatories (NSAIDS) such as Advil, Aleve, Ibuprofen, Motrin, Naproxen, Naprosyn and Aspirin based products such as Excedrin, Goody's Powder, BC Powder. You may continue to take Tylenol  if needed for pain up until the day of surgery.  Stop ANY OVER THE COUNTER supplements until after surgery : VITAMIN C ,CITRACAL + D ,VITAMIN D ,Multiple Vitamin ,Glucosamine-Chondroitin ,PRESERVISION AREDS.  ON THE DAY OF SURGERY ONLY TAKE THESE MEDICATIONS WITH SIPS OF WATER:  gabapentin (NEURONTIN)  metoprolol succinate  pantoprazole (PROTONIX)    Insulin Pump Instructions Per Dr. Cherilyn -   When you become NPO in prep for the surgery, change your target to higher to make sure you don't get low.  Menu=>settings (cog  wheel)=>therapy=>CGM Target=> Continue (warning screen)=>higher and then hit save. **Once you are awake after the surgery and ready to eat, follow the same instructions and go back to usual for the target.  If you get low when NPO, put a few spoonfuls of sugar in your mouth and let it absorb through the mucus membranes so you'll still have an empty stomach.  Bring a set change and sensor change and insulin to be safe (would just do your own thing and don't let the nurses give you shots or prick your finger.    No Alcohol for 24 hours before or after surgery.  No Smoking including e-cigarettes for 24 hours before surgery.  No chewable tobacco products for at least 6 hours before surgery.  No nicotine patches on the day of surgery.  Do not use any recreational drugs for at least a week (preferably 2 weeks) before your surgery.  Please be advised that the combination of cocaine and anesthesia may have negative outcomes, up to and including death. If you test positive for cocaine, your surgery will be cancelled.  On the morning of surgery brush your teeth with toothpaste and water, you may rinse your mouth with mouthwash if you wish. Do not swallow any toothpaste or mouthwash.  Use CHG Soap or wipes as directed on instruction sheet.  Do not wear jewelry, make-up, hairpins, clips or nail polish.  For welded (permanent) jewelry: bracelets, anklets, waist bands, etc.  Please have this removed prior to surgery.  If it is not removed, there is a chance that hospital personnel will need to cut  it off on the day of surgery.  Do not wear lotions, powders, or perfumes.   Do not shave body hair from the neck down 48 hours before surgery.  Contact lenses, hearing aids and dentures may not be worn into surgery.  Do not bring valuables to the hospital. [facility] is not responsible for any missing/lost belongings or valuables.   Notify your doctor if there is any change in your medical  condition (cold, fever, infection).  Wear comfortable clothing (specific to your surgery type) to the hospital.  After surgery, you can help prevent lung complications by doing breathing exercises.  Take deep breaths and cough every 1-2 hours. Your doctor may order a device called an Incentive Spirometer to help you take deep breaths.  When coughing or sneezing, hold a pillow firmly against your incision with both hands. This is called "splinting." Doing this helps protect your incision. It also decreases belly discomfort.  If you are being admitted to the hospital overnight, leave your suitcase in the car. After surgery it may be brought to your room.  In case of increased patient census, it may be necessary for you, the patient, to continue your postoperative care in the Same Day Surgery department.  If you are being discharged the day of surgery, you will not be allowed to drive home. You will need a responsible individual to drive you home and stay with you for 24 hours after surgery.   If you are taking public transportation, you will need to have a responsible individual with you.  Please call the Pre-admissions Testing Dept. at [phone] if you have any questions about these instructions.  Surgery Visitation Policy:  Patients having surgery or a procedure may have two visitors.  Children under the age of 24 must have an adult with them who is not the patient.  Inpatient Visitation:    Visiting hours are 7 a.m. to 8 p.m. Up to four visitors are allowed at one time in a patient room. The visitors may rotate out with other people during the day.  One visitor age 37 or older may stay with the patient overnight and must be in the room by 8 p.m.   Merchandiser, retail to address health-related social needs:  [email or web address]    Pre-operative 5 CHG Bath Instructions   You can play a key role in reducing the risk of infection after surgery. Your skin  needs to be as free of germs as possible. You can reduce the number of germs on your skin by washing with CHG (chlorhexidine gluconate) soap before surgery. CHG is an antiseptic soap that kills germs and continues to kill germs even after washing.   DO NOT use if you have an allergy to chlorhexidine/CHG or antibacterial soaps. If your skin becomes reddened or irritated, stop using the CHG and notify one of our RNs at [phone].   Please shower with the CHG soap starting 4 days before surgery using the following schedule: 09/28 - 10/02.    Please keep in mind the following:  DO NOT shave, including legs and underarms, starting the day of your first shower.   You may shave your face at any point before/day of surgery.  Place clean sheets on your bed the day you start using CHG soap. Use a clean washcloth (not used since being washed) for each shower. DO NOT sleep with pets once you start using the CHG.   CHG Shower Instructions:  If you choose to wash your  hair and private area, wash first with your normal shampoo/soap.  After you use shampoo/soap, rinse your hair and body thoroughly to remove shampoo/soap residue.  Turn the water OFF and apply about 3 tablespoons (45 ml) of CHG soap to a CLEAN washcloth.  Apply CHG soap ONLY FROM YOUR NECK DOWN TO YOUR TOES (washing for 3-5 minutes)  DO NOT use CHG soap on face, private areas, open wounds, or sores.  Pay special attention to the area where your surgery is being performed.  If you are having back surgery, having someone wash your back for you may be helpful. Wait 2 minutes after CHG soap is applied, then you may rinse off the CHG soap.  Pat dry with a clean towel  Put on clean clothes/pajamas   If you choose to wear lotion, please use ONLY the CHG-compatible lotions on the back of this paper.     Additional instructions for the day of surgery: DO NOT APPLY any lotions, deodorants, cologne, or perfumes.   Put on clean/comfortable  clothes.  Brush your teeth.  Ask your nurse before applying any prescription medications to the skin.      CHG Compatible Lotions   Aveeno Moisturizing lotion  Cetaphil Moisturizing Cream  Cetaphil Moisturizing Lotion  Clairol Herbal Essence Moisturizing Lotion, Dry Skin  Clairol Herbal Essence Moisturizing Lotion, Extra Dry Skin  Clairol Herbal Essence Moisturizing Lotion, Normal Skin  Curel Age Defying Therapeutic Moisturizing Lotion with Alpha Hydroxy  Curel Extreme Care Body Lotion  Curel Soothing Hands Moisturizing Hand Lotion  Curel Therapeutic Moisturizing Cream, Fragrance-Free  Curel Therapeutic Moisturizing Lotion, Fragrance-Free  Curel Therapeutic Moisturizing Lotion, Original Formula  Eucerin Daily Replenishing Lotion  Eucerin Dry Skin Therapy Plus Alpha Hydroxy Crme  Eucerin Dry Skin Therapy Plus Alpha Hydroxy Lotion  Eucerin Original Crme  Eucerin Original Lotion  Eucerin Plus Crme Eucerin Plus Lotion  Eucerin TriLipid Replenishing Lotion  Keri Anti-Bacterial Hand Lotion  Keri Deep Conditioning Original Lotion Dry Skin Formula Softly Scented  Keri Deep Conditioning Original Lotion, Fragrance Free Sensitive Skin Formula  Keri Lotion Fast Absorbing Fragrance Free Sensitive Skin Formula  Keri Lotion Fast Absorbing Softly Scented Dry Skin Formula  Keri Original Lotion  Keri Skin Renewal Lotion Keri Silky Smooth Lotion  Keri Silky Smooth Sensitive Skin Lotion  Nivea Body Creamy Conditioning Oil  Nivea Body Extra Enriched Lotion  Nivea Body Original Lotion  Nivea Body Sheer Moisturizing Lotion Nivea Crme  Nivea Skin Firming Lotion  NutraDerm 30 Skin Lotion  NutraDerm Skin Lotion  NutraDerm Therapeutic Skin Cream  NutraDerm Therapeutic Skin Lotion  ProShield Protective Hand Cream  Provon moisturizing lotion  How to Use an Incentive Spirometer  An incentive spirometer is a tool that measures how well you are filling your lungs with each breath. Learning to  take long, deep breaths using this tool can help you keep your lungs clear and active. This may help to reverse or lessen your chance of developing breathing (pulmonary) problems, especially infection. You may be asked to use a spirometer: After a surgery. If you have a lung problem or a history of smoking. After a long period of time when you have been unable to move or be active. If the spirometer includes an indicator to show the highest number that you have reached, your health care provider or respiratory therapist will help you set a goal. Keep a log of your progress as told by your health care provider. What are the risks? Breathing too quickly may cause  dizziness or cause you to pass out. Take your time so you do not get dizzy or light-headed. If you are in pain, you may need to take pain medicine before doing incentive spirometry. It is harder to take a deep breath if you are having pain. How to use your incentive spirometer  Sit up on the edge of your bed or on a chair. Hold the incentive spirometer so that it is in an upright position. Before you use the spirometer, breathe out normally. Place the mouthpiece in your mouth. Make sure your lips are closed tightly around it. Breathe in slowly and as deeply as you can through your mouth, causing the piston or the ball to rise toward the top of the chamber. Hold your breath for 3-5 seconds, or for as long as possible. If the spirometer includes a coach indicator, use this to guide you in breathing. Slow down your breathing if the indicator goes above the marked areas. Remove the mouthpiece from your mouth and breathe out normally. The piston or ball will return to the bottom of the chamber. Rest for a few seconds, then repeat the steps 10 or more times. Take your time and take a few normal breaths between deep breaths so that you do not get dizzy or light-headed. Do this every 1-2 hours when you are awake. If the spirometer includes a goal  marker to show the highest number you have reached (best effort), use this as a goal to work toward during each repetition. After each set of 10 deep breaths, cough a few times. This will help to make sure that your lungs are clear. If you have an incision on your chest or abdomen from surgery, place a pillow or a rolled-up towel firmly against the incision when you cough. This can help to reduce pain while taking deep breaths and coughing. General tips When you are able to get out of bed: Walk around often. Continue to take deep breaths and cough in order to clear your lungs. Keep using the incentive spirometer until your health care provider says it is okay to stop using it. If you have been in the hospital, you may be told to keep using the spirometer at home. Contact a health care provider if: You are having difficulty using the spirometer. You have trouble using the spirometer as often as instructed. Your pain medicine is not giving enough relief for you to use the spirometer as told. You have a fever. Get help right away if: You develop shortness of breath. You develop a cough with bloody mucus from the lungs. You have fluid or blood coming from an incision site after you cough. Summary An incentive spirometer is a tool that can help you learn to take long, deep breaths to keep your lungs clear and active. You may be asked to use a spirometer after a surgery, if you have a lung problem or a history of smoking, or if you have been inactive for a long period of time. Use your incentive spirometer as instructed every 1-2 hours while you are awake. If you have an incision on your chest or abdomen, place a pillow or a rolled-up towel firmly against your incision when you cough. This will help to reduce pain. Get help right away if you have shortness of breath, you cough up bloody mucus, or blood comes from your incision when you cough. This information is not intended to replace advice given  to you by your health care provider.  Make sure you discuss any questions you have with your health care provider. Document Revised: 11/07/2019 Document Reviewed: 11/07/2019 Elsevier Patient Education  2023 Elsevier Inc.   POLAR CARE INFORMATION  MassAdvertisement.it  How to use [facility] Therapy System?  YouTube   ShippingScam.co.uk  OPERATING INSTRUCTIONS  Start the product With dry hands, connect the transformer to the electrical connection located on the top of the cooler. Next, plug the transformer into an appropriate electrical outlet. The unit will automatically start running at this point.  To stop the pump, disconnect electrical power.  Unplug to stop the product when not in use. Unplugging the Polar Care unit turns it off. Always unplug immediately after use. Never leave it plugged in while unattended. Remove pad.    FIRST ADD WATER TO FILL LINE, THEN ICE---Replace ice when existing ice is almost melted  1 Discuss Treatment with your Licensed Health Care Practitioner and Use Only as Prescribed 2 Apply Insulation Barrier & Cold Therapy Pad 3 Check for Moisture 4 Inspect Skin Regularly  Tips and Trouble Shooting Usage Tips 1. Use cubed or chunked ice for optimal performance. 2. It is recommended to drain the Pad between uses. To drain the pad, hold the Pad upright with the hose pointed toward the ground. Depress the black plunger and allow water to drain out. 3. You may disconnect the Pad from the unit without removing the pad from the affected area by depressing the silver tabs on the hose coupling and gently pulling the hoses apart. The Pad and unit will seal itself and will not leak. Note: Some dripping during release is normal. 4. DO NOT RUN PUMP WITHOUT WATER! The pump in this unit is designed to run with water. Running the unit without water will cause permanent damage to the pump. 5. Unplug unit before removing lid.  TROUBLESHOOTING  GUIDE Pump not running, Water not flowing to the pad, Pad is not getting cold 1. Make sure the transformer is plugged into the wall outlet. 2. Confirm that the ice and water are filled to the indicated levels. 3. Make sure there are no kinks in the pad. 4. Gently pull on the blue tube to make sure the tube/pad junction is straight. 5. Remove the pad from the treatment site and ll it while the pad is lying at; then reapply. 6. Confirm that the pad couplings are securely attached to the unit. Listen for the double clicks (Figure 1) to confirm the pad couplings are securely attached.  Leaks    Note: Some condensation on the lines, controller, and pads is unavoidable, especially in warmer climates. 1. If using a [employer] Polar Care Cold Therapy unit with a detachable Cold Therapy Pad, and a leak exists (other than condensation on the lines) disconnect the pad couplings. Make sure the silver tabs on the couplings are depressed before reconnecting the pad to the pump hose; then confirm both sides of the coupling are properly clicked in. 2. If the coupling continues to leak or a leak is detected in the pad itself, stop using it and call [employer] Customer Care at [phone].  Cleaning After use, empty and dry the unit with a soft cloth. Warm water and mild detergent may be used occasionally to clean the pump and tubes.  WARNING: The Polar Care Cube can be cold enough to cause serious injury, including full skin necrosis. Follow these Operating Instructions, and carefully read the Product Insert (see pouch on side of unit) and the  Cold Therapy Pad Fitting Instructions (provided with each Cold Therapy Pad) prior to use.

## 2024-06-02 ENCOUNTER — Ambulatory Visit: Admitting: Anesthesiology

## 2024-06-02 ENCOUNTER — Ambulatory Visit: Payer: Self-pay | Admitting: Urgent Care

## 2024-06-02 ENCOUNTER — Encounter: Payer: Self-pay | Admitting: Oncology

## 2024-06-02 ENCOUNTER — Other Ambulatory Visit: Payer: Self-pay

## 2024-06-02 ENCOUNTER — Encounter
Admission: RE | Disposition: A | Discharge status: Skilled Nursing Facility | Payer: Self-pay | Source: Home / Self Care | Attending: Orthopedic Surgery

## 2024-06-02 ENCOUNTER — Ambulatory Visit

## 2024-06-02 ENCOUNTER — Ambulatory Visit
Admission: RE | Admit: 2024-06-02 | Discharge: 2024-06-03 | Disposition: A | Discharge status: Skilled Nursing Facility | Attending: Orthopedic Surgery | Admitting: Orthopedic Surgery

## 2024-06-02 ENCOUNTER — Encounter: Payer: Self-pay | Admitting: Orthopedic Surgery

## 2024-06-02 DIAGNOSIS — I2510 Atherosclerotic heart disease of native coronary artery without angina pectoris: Secondary | ICD-10-CM | POA: Diagnosis not present

## 2024-06-02 DIAGNOSIS — Z794 Long term (current) use of insulin: Secondary | ICD-10-CM | POA: Diagnosis not present

## 2024-06-02 DIAGNOSIS — Z01812 Encounter for preprocedural laboratory examination: Secondary | ICD-10-CM

## 2024-06-02 DIAGNOSIS — I10 Essential (primary) hypertension: Secondary | ICD-10-CM | POA: Diagnosis not present

## 2024-06-02 DIAGNOSIS — K219 Gastro-esophageal reflux disease without esophagitis: Secondary | ICD-10-CM | POA: Insufficient documentation

## 2024-06-02 DIAGNOSIS — Z96652 Presence of left artificial knee joint: Secondary | ICD-10-CM

## 2024-06-02 DIAGNOSIS — M170 Bilateral primary osteoarthritis of knee: Secondary | ICD-10-CM | POA: Diagnosis present

## 2024-06-02 DIAGNOSIS — E109 Type 1 diabetes mellitus without complications: Secondary | ICD-10-CM | POA: Insufficient documentation

## 2024-06-02 DIAGNOSIS — E119 Type 2 diabetes mellitus without complications: Secondary | ICD-10-CM

## 2024-06-02 DIAGNOSIS — M25762 Osteophyte, left knee: Secondary | ICD-10-CM | POA: Diagnosis not present

## 2024-06-02 HISTORY — PX: TOTAL KNEE ARTHROPLASTY: SHX125

## 2024-06-02 LAB — GLUCOSE, CAPILLARY
Glucose-Capillary: 115 mg/dL — ABNORMAL HIGH (ref 70–99)
Glucose-Capillary: 110 mg/dL — ABNORMAL HIGH (ref 70–99)
Glucose-Capillary: 106 mg/dL — ABNORMAL HIGH (ref 70–99)
Glucose-Capillary: 237 mg/dL — ABNORMAL HIGH (ref 70–99)

## 2024-06-02 SURGERY — ARTHROPLASTY, KNEE, TOTAL
Anesthesia: Spinal | Site: Knee | Laterality: Left

## 2024-06-02 MED ORDER — ACETAMINOPHEN 10 MG/ML IV SOLN: 1000.0000 mg | Freq: Once | INTRAVENOUS | Status: DC | PRN

## 2024-06-02 MED ORDER — METOCLOPRAMIDE HCL 10 MG PO TABS: 5.0000 mg | ORAL_TABLET | Freq: Three times a day (TID) | ORAL | Status: DC | PRN

## 2024-06-02 MED ORDER — SODIUM CHLORIDE 0.9 % IV SOLN: INTRAVENOUS | Status: DC

## 2024-06-02 MED ORDER — ACETAMINOPHEN 325 MG PO TABS: 325.0000 mg | ORAL_TABLET | Freq: Four times a day (QID) | ORAL | Status: DC | PRN

## 2024-06-02 MED ORDER — PHENOL 1.4 % MT LIQD: 1.0000 | OROMUCOSAL | Status: DC | PRN

## 2024-06-02 MED ORDER — LACTATED RINGERS IV SOLN: INTRAVENOUS | Status: DC

## 2024-06-02 MED ORDER — TRANEXAMIC ACID-NACL 1000-0.7 MG/100ML-% IV SOLN
INTRAVENOUS | Status: DC | PRN
  Administered 2024-06-02 (×2): 1000 mg via INTRAVENOUS

## 2024-06-02 MED ORDER — METOPROLOL SUCCINATE ER 25 MG PO TB24
25.0000 mg | ORAL_TABLET | Freq: Two times a day (BID) | ORAL | Status: DC
  Administered 2024-06-02 – 2024-06-03 (×2): 25 mg via ORAL
  Filled 2024-06-02 (×2): qty 1

## 2024-06-02 MED ORDER — CHLORTHALIDONE 25 MG PO TABS
12.5000 mg | ORAL_TABLET | ORAL | Status: DC
  Administered 2024-06-03: 12.5 mg via ORAL
  Filled 2024-06-02: qty 0.5

## 2024-06-02 MED ORDER — CEFAZOLIN SODIUM-DEXTROSE 2-4 GM/100ML-% IV SOLN
2.0000 g | Freq: Four times a day (QID) | INTRAVENOUS | Status: AC
  Administered 2024-06-02 (×2): 2 g via INTRAVENOUS
  Filled 2024-06-02: qty 100

## 2024-06-02 MED ORDER — ONDANSETRON HCL 4 MG/2ML IJ SOLN
INTRAMUSCULAR | Status: DC | PRN
  Administered 2024-06-02: 4 mg via INTRAVENOUS

## 2024-06-02 MED ORDER — BUPIVACAINE-EPINEPHRINE (PF) 0.25% -1:200000 IJ SOLN
INTRAMUSCULAR | Status: AC
  Filled 2024-06-02: qty 30

## 2024-06-02 MED ORDER — PHENYLEPHRINE HCL-NACL 20-0.9 MG/250ML-% IV SOLN
INTRAVENOUS | Status: AC
  Filled 2024-06-02: qty 250

## 2024-06-02 MED ORDER — TRANEXAMIC ACID-NACL 1000-0.7 MG/100ML-% IV SOLN: 1000.0000 mg | INTRAVENOUS | Status: DC

## 2024-06-02 MED ORDER — PANTOPRAZOLE SODIUM 40 MG PO TBEC
40.0000 mg | DELAYED_RELEASE_TABLET | Freq: Every day | ORAL | Status: DC
  Administered 2024-06-03: 40 mg via ORAL
  Filled 2024-06-02 (×2): qty 1

## 2024-06-02 MED ORDER — INSULIN PUMP
SUBCUTANEOUS | Status: DC
  Filled 2024-06-02: qty 1

## 2024-06-02 MED ORDER — CEFAZOLIN SODIUM-DEXTROSE 2-4 GM/100ML-% IV SOLN
INTRAVENOUS | Status: AC
  Filled 2024-06-02: qty 100

## 2024-06-02 MED ORDER — MORPHINE SULFATE (PF) 2 MG/ML IV SOLN: 0.5000 mg | INTRAVENOUS | Status: DC | PRN

## 2024-06-02 MED ORDER — ONDANSETRON HCL 4 MG PO TABS: 4.0000 mg | ORAL_TABLET | Freq: Four times a day (QID) | ORAL | Status: DC | PRN

## 2024-06-02 MED ORDER — ACETAMINOPHEN 500 MG PO TABS
1000.0000 mg | ORAL_TABLET | Freq: Three times a day (TID) | ORAL | Status: DC
  Administered 2024-06-02 – 2024-06-03 (×3): 1000 mg via ORAL
  Filled 2024-06-02 (×3): qty 2

## 2024-06-02 MED ORDER — DOCUSATE SODIUM 100 MG PO CAPS
100.0000 mg | ORAL_CAPSULE | Freq: Two times a day (BID) | ORAL | Status: DC
  Administered 2024-06-02 – 2024-06-03 (×2): 100 mg via ORAL
  Filled 2024-06-02: qty 1

## 2024-06-02 MED ORDER — ACETAMINOPHEN 10 MG/ML IV SOLN
INTRAVENOUS | Status: DC | PRN
  Administered 2024-06-02: 1000 mg via INTRAVENOUS

## 2024-06-02 MED ORDER — ASPIRIN 81 MG PO TBEC
81.0000 mg | DELAYED_RELEASE_TABLET | Freq: Every day | ORAL | Status: DC
  Administered 2024-06-03: 81 mg via ORAL

## 2024-06-02 MED ORDER — PROPOFOL 1000 MG/100ML IV EMUL
INTRAVENOUS | Status: AC
Start: 2024-06-02 — End: 2024-06-02
  Filled 2024-06-02: qty 100

## 2024-06-02 MED ORDER — TRAMADOL HCL 50 MG PO TABS
25.0000 mg | ORAL_TABLET | Freq: Four times a day (QID) | ORAL | 0 refills | Status: DC | PRN
  Filled 2024-06-02: qty 30, 8d supply, fill #0

## 2024-06-02 MED ORDER — CELECOXIB 100 MG PO CAPS
100.0000 mg | ORAL_CAPSULE | Freq: Two times a day (BID) | ORAL | 0 refills | Status: AC
  Filled 2024-06-02: qty 14, 7d supply, fill #0

## 2024-06-02 MED ORDER — MENTHOL 3 MG MT LOZG: 1.0000 | LOZENGE | OROMUCOSAL | Status: DC | PRN

## 2024-06-02 MED ORDER — EZETIMIBE 10 MG PO TABS
10.0000 mg | ORAL_TABLET | Freq: Every day | ORAL | Status: DC
  Administered 2024-06-02: 10 mg via ORAL

## 2024-06-02 MED ORDER — PROPOFOL 10 MG/ML IV BOLUS
INTRAVENOUS | Status: AC
Start: 2024-06-02 — End: 2024-06-02
  Filled 2024-06-02: qty 20

## 2024-06-02 MED ORDER — DOCUSATE SODIUM 100 MG PO CAPS
ORAL_CAPSULE | ORAL | Status: AC
  Filled 2024-06-02: qty 1

## 2024-06-02 MED ORDER — TRANEXAMIC ACID-NACL 1000-0.7 MG/100ML-% IV SOLN
INTRAVENOUS | Status: AC
  Filled 2024-06-02: qty 100

## 2024-06-02 MED ORDER — OXYCODONE HCL 5 MG PO TABS: 5.0000 mg | ORAL_TABLET | Freq: Once | ORAL | Status: DC | PRN

## 2024-06-02 MED ORDER — FENTANYL CITRATE (PF) 100 MCG/2ML IJ SOLN
INTRAMUSCULAR | Status: DC | PRN
  Administered 2024-06-02 (×2): 25 ug via INTRAVENOUS
  Administered 2024-06-02: 50 ug via INTRAVENOUS

## 2024-06-02 MED ORDER — CHLORHEXIDINE GLUCONATE 0.12 % MT SOLN
15.0000 mL | Freq: Once | OROMUCOSAL | Status: AC
  Administered 2024-06-02: 15 mL via OROMUCOSAL

## 2024-06-02 MED ORDER — METOCLOPRAMIDE HCL 5 MG/ML IJ SOLN: 5.0000 mg | Freq: Three times a day (TID) | INTRAMUSCULAR | Status: DC | PRN

## 2024-06-02 MED ORDER — LIDOCAINE HCL (PF) 2 % IJ SOLN
INTRAMUSCULAR | Status: AC
  Filled 2024-06-02: qty 5

## 2024-06-02 MED ORDER — GABAPENTIN 100 MG PO CAPS
ORAL_CAPSULE | ORAL | Status: AC
  Filled 2024-06-02: qty 1

## 2024-06-02 MED ORDER — DOCUSATE SODIUM 100 MG PO CAPS
100.0000 mg | ORAL_CAPSULE | Freq: Two times a day (BID) | ORAL | 0 refills | Status: AC
  Filled 2024-06-02: qty 10, 5d supply, fill #0

## 2024-06-02 MED ORDER — OXYCODONE HCL 5 MG/5ML PO SOLN: 5.0000 mg | Freq: Once | ORAL | Status: DC | PRN

## 2024-06-02 MED ORDER — DEXAMETHASONE SODIUM PHOSPHATE 10 MG/ML IJ SOLN
8.0000 mg | Freq: Once | INTRAMUSCULAR | Status: AC
Start: 2024-06-02 — End: 2024-06-02
  Administered 2024-06-02: 8 mg via INTRAVENOUS

## 2024-06-02 MED ORDER — ONDANSETRON HCL 4 MG/2ML IJ SOLN: 4.0000 mg | Freq: Four times a day (QID) | INTRAMUSCULAR | Status: DC | PRN

## 2024-06-02 MED ORDER — INSULIN ASPART 100 UNIT/ML IJ SOLN: 0.0000 [IU] | Freq: Three times a day (TID) | INTRAMUSCULAR | Status: DC

## 2024-06-02 MED ORDER — ORAL CARE MOUTH RINSE: 15.0000 mL | Freq: Once | OROMUCOSAL | Status: AC

## 2024-06-02 MED ORDER — BUPIVACAINE HCL (PF) 0.5 % IJ SOLN
INTRAMUSCULAR | Status: DC | PRN
  Administered 2024-06-02: 2.8 mL via INTRATHECAL

## 2024-06-02 MED ORDER — KETOROLAC TROMETHAMINE 30 MG/ML IJ SOLN
INTRAMUSCULAR | Status: AC
Start: 2024-06-02 — End: 2024-06-02
  Filled 2024-06-02: qty 1

## 2024-06-02 MED ORDER — TRANEXAMIC ACID-NACL 1000-0.7 MG/100ML-% IV SOLN
INTRAVENOUS | Status: AC
Start: 2024-06-02 — End: 2024-06-02
  Filled 2024-06-02: qty 100

## 2024-06-02 MED ORDER — SODIUM CHLORIDE (PF) 0.9 % IJ SOLN
INTRAMUSCULAR | Status: DC | PRN
  Administered 2024-06-02: 71 mL

## 2024-06-02 MED ORDER — TRAMADOL HCL 50 MG PO TABS: 50.0000 mg | ORAL_TABLET | Freq: Four times a day (QID) | ORAL | Status: DC | PRN

## 2024-06-02 MED ORDER — HYDROCODONE-ACETAMINOPHEN 5-325 MG PO TABS
1.0000 | ORAL_TABLET | ORAL | Status: DC | PRN
  Administered 2024-06-02: 2 via ORAL
  Filled 2024-06-02: qty 2

## 2024-06-02 MED ORDER — TRAMADOL HCL 50 MG PO TABS
ORAL_TABLET | ORAL | Status: AC
  Filled 2024-06-02: qty 1

## 2024-06-02 MED ORDER — SODIUM CHLORIDE 0.9 % IR SOLN
Status: DC | PRN
  Administered 2024-06-02: 3000 mL

## 2024-06-02 MED ORDER — ENOXAPARIN SODIUM 30 MG/0.3ML IJ SOSY
30.0000 mg | PREFILLED_SYRINGE | Freq: Two times a day (BID) | INTRAMUSCULAR | Status: DC
  Administered 2024-06-03: 30 mg via SUBCUTANEOUS
  Filled 2024-06-02: qty 0.3

## 2024-06-02 MED ORDER — EZETIMIBE 10 MG PO TABS
ORAL_TABLET | ORAL | Status: AC
  Filled 2024-06-02: qty 1

## 2024-06-02 MED ORDER — ENOXAPARIN SODIUM 40 MG/0.4ML IJ SOSY
40.0000 mg | PREFILLED_SYRINGE | INTRAMUSCULAR | 0 refills | Status: AC
  Filled 2024-06-02: qty 5.6, 14d supply, fill #0

## 2024-06-02 MED ORDER — ONDANSETRON HCL 4 MG/2ML IJ SOLN: 4.0000 mg | Freq: Once | INTRAMUSCULAR | Status: DC | PRN

## 2024-06-02 MED ORDER — CHLORHEXIDINE GLUCONATE 0.12 % MT SOLN
OROMUCOSAL | Status: AC
  Filled 2024-06-02: qty 15

## 2024-06-02 MED ORDER — ACETAMINOPHEN 500 MG PO TABS
1000.0000 mg | ORAL_TABLET | Freq: Three times a day (TID) | ORAL | 0 refills | Status: AC
  Filled 2024-06-02: qty 30, 5d supply, fill #0

## 2024-06-02 MED ORDER — PHENYLEPHRINE HCL-NACL 20-0.9 MG/250ML-% IV SOLN
INTRAVENOUS | Status: DC | PRN
  Administered 2024-06-02: 40 ug/min via INTRAVENOUS

## 2024-06-02 MED ORDER — GABAPENTIN 100 MG PO CAPS
100.0000 mg | ORAL_CAPSULE | Freq: Two times a day (BID) | ORAL | Status: DC
  Administered 2024-06-02 – 2024-06-03 (×3): 100 mg via ORAL
  Filled 2024-06-02: qty 1

## 2024-06-02 MED ORDER — ORAL CARE MOUTH RINSE: 15.0000 mL | OROMUCOSAL | Status: DC | PRN

## 2024-06-02 MED ORDER — FENTANYL CITRATE (PF) 100 MCG/2ML IJ SOLN
INTRAMUSCULAR | Status: AC
  Filled 2024-06-02: qty 2

## 2024-06-02 MED ORDER — TRAMADOL HCL 50 MG PO TABS
25.0000 mg | ORAL_TABLET | Freq: Four times a day (QID) | ORAL | Status: DC | PRN
  Administered 2024-06-02: 50 mg via ORAL
  Filled 2024-06-02: qty 1

## 2024-06-02 MED ORDER — LOSARTAN POTASSIUM 50 MG PO TABS
100.0000 mg | ORAL_TABLET | Freq: Every day | ORAL | Status: DC
  Administered 2024-06-02 – 2024-06-03 (×2): 100 mg via ORAL
  Filled 2024-06-02 (×2): qty 2

## 2024-06-02 MED ORDER — INSULIN ASPART 100 UNIT/ML IJ SOLN: 0.0000 [IU] | Freq: Every day | INTRAMUSCULAR | Status: DC

## 2024-06-02 MED ORDER — CEFAZOLIN SODIUM-DEXTROSE 2-4 GM/100ML-% IV SOLN
2.0000 g | INTRAVENOUS | Status: AC
  Administered 2024-06-02: 2 g via INTRAVENOUS

## 2024-06-02 MED ORDER — BUPIVACAINE LIPOSOME 1.3 % IJ SUSP
INTRAMUSCULAR | Status: AC
Start: 2024-06-02 — End: 2024-06-02
  Filled 2024-06-02: qty 20

## 2024-06-02 MED ORDER — ACETAMINOPHEN 10 MG/ML IV SOLN
INTRAVENOUS | Status: AC
Start: 2024-06-02 — End: 2024-06-02
  Filled 2024-06-02: qty 100

## 2024-06-02 MED ORDER — KETOROLAC TROMETHAMINE 15 MG/ML IJ SOLN
7.5000 mg | Freq: Four times a day (QID) | INTRAMUSCULAR | Status: DC
  Administered 2024-06-02 – 2024-06-03 (×3): 7.5 mg via INTRAVENOUS
  Filled 2024-06-02 (×3): qty 1

## 2024-06-02 MED ORDER — PROPOFOL 10 MG/ML IV BOLUS
INTRAVENOUS | Status: DC | PRN
  Administered 2024-06-02: 20 mg via INTRAVENOUS
  Administered 2024-06-02 (×2): 10 mg via INTRAVENOUS
  Administered 2024-06-02: 75 ug/kg/min via INTRAVENOUS
  Administered 2024-06-02: 10 mg via INTRAVENOUS

## 2024-06-02 MED ORDER — SURGIPHOR WOUND IRRIGATION SYSTEM - OPTIME
TOPICAL | Status: DC | PRN
  Administered 2024-06-02: 450 mL via TOPICAL

## 2024-06-02 MED ORDER — SODIUM CHLORIDE (PF) 0.9 % IJ SOLN
INTRAMUSCULAR | Status: AC
Start: 2024-06-02 — End: 2024-06-02
  Filled 2024-06-02: qty 20

## 2024-06-02 MED ORDER — FENTANYL CITRATE (PF) 100 MCG/2ML IJ SOLN: 25.0000 ug | INTRAMUSCULAR | Status: DC | PRN

## 2024-06-02 MED ORDER — ONDANSETRON HCL 4 MG PO TABS
4.0000 mg | ORAL_TABLET | Freq: Four times a day (QID) | ORAL | 0 refills | Status: AC | PRN
  Filled 2024-06-02: qty 20, 5d supply, fill #0

## 2024-06-02 SURGICAL SUPPLY — 60 items
BLADE PATELLA REAM PILOT HOLE (MISCELLANEOUS) IMPLANT
BLADE SAW 90X13X1.19 OSCILLAT (BLADE) IMPLANT
BLADE SAW SAG 25X90X1.19 (BLADE) ×1 IMPLANT
BLADE SAW SAG 29X58X.64 (BLADE) ×1 IMPLANT
BNDG ELASTIC 6INX 5YD STR LF (GAUZE/BANDAGES/DRESSINGS) ×1 IMPLANT
BOWL CEMENT MIX W/ADAPTER (MISCELLANEOUS) IMPLANT
BRUSH SCRUB EZ PLAIN DRY (MISCELLANEOUS) IMPLANT
CEMENT BONE R 1X40 (Cement) IMPLANT
CHLORAPREP W/TINT 26 (MISCELLANEOUS) ×2 IMPLANT
COMPONENT FEM CMT PRSN STD SZ7 (Knees) IMPLANT
COOLER ICEMAN CLASSIC (MISCELLANEOUS) ×1 IMPLANT
CUFF TRNQT CYL 24X4X16.5-23 (TOURNIQUET CUFF) IMPLANT
CUFF TRNQT CYL 30X4X21-28X (TOURNIQUET CUFF) IMPLANT
DERMABOND ADVANCED .7 DNX12 (GAUZE/BANDAGES/DRESSINGS) ×1 IMPLANT
DRAPE SHEET LG 3/4 BI-LAMINATE (DRAPES) ×2 IMPLANT
DRSG MEPILEX SACRM 8.7X9.8 (GAUZE/BANDAGES/DRESSINGS) ×1 IMPLANT
DRSG OPSITE POSTOP 4X10 (GAUZE/BANDAGES/DRESSINGS) IMPLANT
DRSG OPSITE POSTOP 4X8 (GAUZE/BANDAGES/DRESSINGS) IMPLANT
ELECTRODE REM PT RTRN 9FT ADLT (ELECTROSURGICAL) ×1 IMPLANT
GLOVE BIO SURGEON STRL SZ8 (GLOVE) ×1 IMPLANT
GLOVE BIOGEL PI IND STRL 8 (GLOVE) ×1 IMPLANT
GLOVE PI ORTHO PRO STRL 7.5 (GLOVE) ×2 IMPLANT
GLOVE PI ORTHO PRO STRL SZ8 (GLOVE) ×2 IMPLANT
GLOVE SURG SYN 7.5 PF PI (GLOVE) ×1 IMPLANT
GOWN SRG XL LVL 3 NONREINFORCE (GOWNS) ×1 IMPLANT
GOWN STRL REUS W/ TWL LRG LVL3 (GOWN DISPOSABLE) ×1 IMPLANT
GOWN STRL REUS W/ TWL XL LVL3 (GOWN DISPOSABLE) ×1 IMPLANT
HOOD PEEL AWAY T7 (MISCELLANEOUS) ×2 IMPLANT
KIT TURNOVER KIT A (KITS) ×1 IMPLANT
LINER TIB ASF PS CD/6-7 11 LT (Liner) IMPLANT
MANIFOLD NEPTUNE II (INSTRUMENTS) ×1 IMPLANT
MARKER SKIN DUAL TIP RULER LAB (MISCELLANEOUS) ×1 IMPLANT
MAT ABSORB FLUID 56X50 GRAY (MISCELLANEOUS) ×1 IMPLANT
NDL HYPO 21X1.5 SAFETY (NEEDLE) ×1 IMPLANT
NEEDLE HYPO 21X1.5 SAFETY (NEEDLE) ×1 IMPLANT
PACK TOTAL KNEE (MISCELLANEOUS) ×1 IMPLANT
PAD ARMBOARD POSITIONER FOAM (MISCELLANEOUS) ×3 IMPLANT
PAD COLD UNI WRAP-ON (PAD) ×1 IMPLANT
PENCIL SMOKE EVACUATOR (MISCELLANEOUS) ×1 IMPLANT
PIN DRILL HDLS TROCAR 75 4PK (PIN) IMPLANT
SCREW FEMALE HEX FIX 25X2.5 (ORTHOPEDIC DISPOSABLE SUPPLIES) IMPLANT
SCREW HEX HEADED 3.5X27 DISP (ORTHOPEDIC DISPOSABLE SUPPLIES) IMPLANT
SLEEVE SCD COMPRESS KNEE MED (STOCKING) ×1 IMPLANT
SOL .9 NS 3000ML IRR UROMATIC (IV SOLUTION) ×1 IMPLANT
SOLN STERILE WATER 1000 ML (IV SOLUTION) ×1 IMPLANT
SOLN STERILE WATER BTL 1000 ML (IV SOLUTION) ×1 IMPLANT
SOLUTION IRRIG SURGIPHOR (IV SOLUTION) ×1 IMPLANT
STEM POLY PAT PLY 32M KNEE (Knees) IMPLANT
STEM TIB ST PERS 14+30 (Stem) IMPLANT
STEM TIBIA 5 DEG SZ D L KNEE (Knees) IMPLANT
STOCKINETTE IMPERV 14X48 (MISCELLANEOUS) ×1 IMPLANT
SUT STRATAFIX 14 PDO 36 VLT (SUTURE) ×1 IMPLANT
SUT VIC AB 0 CT1 36 (SUTURE) ×1 IMPLANT
SUT VIC AB 2-0 CT2 27 (SUTURE) ×2 IMPLANT
SUTURE STRATA SPIR 4-0 18 (SUTURE) ×1 IMPLANT
SUTURE VICRYL 1-0 27IN ABS (SUTURE) ×1 IMPLANT
SYR 20ML LL LF (SYRINGE) ×2 IMPLANT
TAPE CLOTH 3X10 WHT NS LF (GAUZE/BANDAGES/DRESSINGS) ×1 IMPLANT
TIP FAN IRRIG PULSAVAC PLUS (DISPOSABLE) ×1 IMPLANT
TRAP FLUID SMOKE EVACUATOR (MISCELLANEOUS) ×1 IMPLANT

## 2024-06-02 NOTE — Anesthesia Postprocedure Evaluation (Signed)
 Anesthesia Post Note  Patient: Cassandra Gentry  Procedure(s) Performed: ARTHROPLASTY, KNEE, TOTAL (Left: Knee)  Patient location during evaluation: PACU Anesthesia Type: Spinal Level of consciousness: awake and alert Pain management: pain level controlled Vital Signs Assessment: post-procedure vital signs reviewed and stable Respiratory status: spontaneous breathing and respiratory function stable Cardiovascular status: blood pressure returned to baseline and stable Postop Assessment: no headache, no backache and no apparent nausea or vomiting Anesthetic complications: no   No notable events documented.   Last Vitals:  Vitals:   06/02/24 1300 06/02/24 1315  BP: (!) 135/59 (!) 144/65  Pulse: 67 (!) 56  Resp: 16 12  Temp:    SpO2: 100% 100%    Last Pain:  Vitals:   06/02/24 1254  TempSrc: Tympanic  PainSc: 0-No pain                 Krisa Blattner

## 2024-06-02 NOTE — H&P (Signed)
 History of Present Illness: The patient is an 89 y.o. female seen in clinic today for history and physical for left total knee arthroplasty with Dr. Lorelle on 06/02/2024. She has advanced left knee osteoarthritis with an relief. Been severe for many years and she has had conservative treatment consisting of cortisone injections and gel injections with diminishing results of last few years. Pain is interfering with her quality of life and activities of daily living. She is walking with a cane knee from buckling and giving way. Seen Dr. Lorelle discussed left total knee arthroplasty and agreed to set up procedure. She also has advanced right knee osteoarthritis and is hopeful for right total knee arthroplasty in 3 months. Patient lives at the [facility].  Patient is a non-smoker, has an A1c of 6.6 and a BMI of 27.5  Past Medical History: Past Medical History:  Diagnosis Date  Arthritis  CLL (chronic lymphocytic leukemia) (CMS/HHS-HCC)  Coronary artery disease  Diabetes mellitus type I (CMS/HHS-HCC)  Diverticulosis  Fibrocystic breast disease  GERD (gastroesophageal reflux disease)  Hyperlipidemia  Hypertension  Hypothyroidism  Insulin pump status  Ischemic heart disease  Leukemia, chronic (CMS/HHS-HCC)  Osteoporosis, post-menopausal  Sinus arrhythmia 07/23/2018   Past Surgical History: Past Surgical History:  Procedure Laterality Date  TUBAL LIGATION 1974  APPENDECTOMY 08/1998  EGD 04/28/2006  COLONOSCOPY 04/28/2006  11/12/2005, 08/15/1997; Int Hemorrhoids, Diverticulosis: CBF 04/2016; Recall Ltr mailed 02/28/2016 (dw)  COLONOSCOPY 10/02/2021  Sessile serrated polyp/severe diverticulosis/No repeat due to age/TKT  EGD 10/02/2021  Normal EGD biopsy/No repeat/TKT  APPENDECTOMY  CATARACT EXTRACTION 06-08-18(left Eye)  right eye scheduled 07-08-18  CORONARY ARTERY BYPASS GRAFT   Past Family History: Family History  Problem Relation Age of Onset  Diabetes Father  High  blood pressure (Hypertension) Father  Diabetes Paternal Uncle  Diabetes Paternal Grandmother  Osteoporosis (Thinning of bones) Mother  Arthritis Mother  Gout Maternal Aunt   Medications: Current Outpatient Medications  Medication Sig Dispense Refill  *lactobacillus rhamnosus gg oral 1 tab by mouth daily  *multivitamin oral 1 tab by mouth daily  *omega-3 fatty acids oral 1 cap by mouth daily  *psyllium seed oral 2 cap by mouth daily  acetaminophen  (TYLENOL ) 500 MG tablet Take by mouth every 6 (six) hours as needed  alendronate (FOSAMAX) 70 MG tablet Take 1 tablet (70 mg total) by mouth every 7 (seven) days 12 tablet 3  ascorbic acid (VITAMIN C) 500 MG tablet 1 tab by mouth daily  aspirin 81 mg Cap Take 1 tablet by mouth once daily  calcium carbonate (CALCIUM 500 ORAL) Take by mouth  chlorthalidone 25 MG tablet TAKE 0.5 TABLETS (12.5 MG TOTAL) BY MOUTH EVERY MONDAY, WEDNESDAY, AND FRIDAY AND 1 TAB ON SAT & SUN 36 tablet 3  cholecalciferol (VITAMIN D3) 1,000 unit capsule 1 cap by mouth daily  CONTOUR NEXT TEST STRIPS test strip 6 (six) times daily Use as instructed. (Patient not taking: Reported on 06/24/2023) 600 each 3  ezetimibe (ZETIA) 10 mg tablet TAKE 1 TABLET BY MOUTH EVERY DAY 90 tablet 4  fluocinonide (LIDEX) 0.05 % cream Apply 1 Application topically once daily as needed Use as directed  gabapentin (NEURONTIN) 300 MG capsule TAKE 1 CAPSULE BY MOUTH TWICE A DAY 180 capsule 1  GLUCOSAMINE HCL/CHONDR SU A NA (OSTEO BI-FLEX ORAL) Take 1 tablet by mouth daily.  insulin ASPART (NOVOLOG U-100 INSULIN ASPART) injection (concentration 100 units/mL) USE UP TO 80 UNITS SUBCUTANEOUSLY IN INSULIN PUMP PER DAY 80 mL 4  insulin syringe-needle U-100 (  ULTICARE) 0.3 mL 30 gauge x 1/2 syringe USE TO INJECT INSULIN IN CASE OF PUMP FAILURE 100 each 6  lancets CHECK BLOOD SUGAR 8 TIMES PER DAY. 200 each 11  losartan (COZAAR) 100 MG tablet TAKE 1 TABLET BY MOUTH EVERY DAY 60 tablet 0  metoprolol  succinate (TOPROL-XL) 50 MG XL tablet TAKE 1/2 TABLET TWICE A DAY BY MOUTH 90 tablet 3  mometasone (ELOCON) 0.1 % cream Apply topically once daily  multivitamin with minerals tablet Take by mouth  pantoprazole (PROTONIX) 40 MG DR tablet TAKE 1 TABLET BY MOUTH EVERY DAY 90 tablet 1  subcutaneous insulin pump Misc Give Insulin Pump Supplies: Medtronic Minimed; change site q3 days MRN [id number] (Patient not taking: Reported on 06/24/2023) 30 each 4  vit C/E/Zn/coppr/lutein/zeaxan (PRESERVISION AREDS-2 ORAL) Take by mouth 2 (two) times daily   No current facility-administered medications for this visit.   Allergies: Allergies  Allergen Reactions  Amoxicillin-Pot Clavulanate Nausea and Other (See Comments)  Has patient had a PCN reaction causing immediate rash, facial/tongue/throat swelling, SOB or lightheadedness with hypotension: No Has patient had a PCN reaction causing severe rash involving mucus membranes or skin necrosis: No Has patient had a PCN reaction that required hospitalization: No Has patient had a PCN reaction occurring within the last 10 years: No If all of the above answers are NO, then may proceed with Cephalosporin use.  Shellfish Containing Products Nausea  Simvastatin Other (See Comments)  Joint pain    Visit Vitals: Vitals:  05/18/24 1435  BP: 138/70    Review of Systems:  A comprehensive 14 point ROS was performed, reviewed, and the pertinent orthopaedic findings are documented in the HPI.  Physical Exam: General:  Well developed, well nourished, no apparent distress, normal affect, antalgic gait with a cane.   HEENT: Head normocephalic, atraumatic, PERRL.   Abdomen: Soft, non tender, non distended, Bowel sounds present.  Heart: Examination of the heart reveals regular, rate, and rhythm. There is no murmur noted on ascultation. There is a normal apical pulse.  Lungs: Lungs are clear to auscultation. There is no wheeze, rhonchi, or crackles. There is  normal expansion of bilateral chest walls.   Comprehensive Knee Exam: Gait Non-antalgic and fluid  Alignment Neutral   Inspection Right Left  Skin Normal appearance with no obvious deformity. No ecchymosis or erythema. Normal appearance with no obvious deformity. Skin especially distally over the tibia with some dry areas no wounds manage. No ecchymosis or erythema.  Soft Tissue No focal soft tissue swelling No focal soft tissue swelling  Quad Atrophy None None   Palpation  Right Left  Tenderness Medial, lateral, joint lines and parapatellar tenderness to palpation Medial, lateral joint line and parapatellar tenderness to palpation  Crepitus + patellofemoral and tibiofemoral crepitus + patellofemoral and tibiofemoral crepitus  Effusion None None   Range of Motion Right Left  Flexion 0-115 0-110  Extension Full knee extension without hyperextension Full knee extension without hyperextension   Ligamentous Exam Right Left  Lachman Normal Normal  Valgus 0 Normal Normal  Valgus 30 Normal Normal  Varus 0 Partially correctable valgus deformity with endpoint Partially correctable valgus deformity with endpoint  Varus 30 Normal Normal  Anterior Drawer Normal Normal  Posterior Drawer Normal Normal   Meniscal Exam Right Left  Hyperflexion Test Positive Positive  Hyperextension Test Positive Positive  McMurray's Positive Positive   Neurovascular Right Left  Quadriceps Strength 5/5 5/5  Hamstring Strength 5/5 5/5  Hip Abductor Strength 4/5 4/5  Distal Motor  Normal Normal  Distal Sensory Normal light touch sensation Normal light touch sensation  Distal Pulses Normal Normal    Imaging Studies: I reviewed 5 view x-rays of the bilateral knees AP, lateral, sunrise, and flexed PA weightbearing films. There is bilateral tricompartmental severe degenerative changes in both knees. There is lateral bone-on-bone articulation of both knees with valgus deformity, there is osteophyte  formation, sclerosis and subchondral cyst formation. Kellgren-Lawrence grade 4 in both knees. No fractures noted.   Assessment:  Left knee osteoarthritis  Plan: Cassandra Gentry is an 89 year old female presents with advanced left knee osteoarthritis with complete loss of joint space. She has had years of conservative treatment with diminishing results. Pain has interfered with her quality of life and activities daily living. She is seen Dr. Lorelle, discussed total knee arthroplasty as well as agreed consented procedure. Risks, benefits, complications of a left total knee arthroplasty have been discussed with the patient. Patient has agreed and consented for the procedure with Dr. Lorelle on 09/03/2023  The hospitalization and post-operative care and rehabilitation were also discussed. The use of perioperative antibiotics and DVT prophylaxis were discussed. The risk, benefits and alternatives to a surgical intervention were discussed at length with the patient. The patient was also advised of risks related to the medical comorbidities and elevated body mass index (BMI). A lengthy discussion took place to review the most common complications including but not limited to: stiffness, loss of function, complex regional pain syndrome, deep vein thrombosis, pulmonary embolus, heart attack, stroke, infection, wound breakdown, numbness, intraoperative fracture, damage to nerves, tendon,muscles, arteries or other blood vessels, death and other possible complications from anesthesia. The patient was told that we will take steps to minimize these risks by using sterile technique, antibiotics and DVT prophylaxis when appropriate and follow the patient postoperatively in the office setting to monitor progress. The possibility of recurrent pain, no improvement in pain and actual worsening of pain were also discussed with the patient.

## 2024-06-02 NOTE — Op Note (Signed)
 Patient Name: Cassandra Gentry  FMW:[id number]  Pre-Operative Diagnosis: Left knee Osteoarthritis  Post-Operative Diagnosis: (same)  Procedure: Left Total Knee Arthroplasty  Components/Implants: Femur: Persona Size Size 7 Std CR Left   Tibia: Persona Size D w/ 30x14 mm stem ext  Poly: 11mm  MC  Patella: 32mm symmetric button  Femoral Valgus Cut Angle: 5 degreese  Distal Femoral Re-cut: none  Patella Resurfacing: yes   Date of Surgery: 06/02/2024  Surgeon: Arthea Sheer MD  Assistant: Debby Amber PA (present and scrubbed throughout the case, critical for assistance with exposure, retraction, instrumentation, and closure)   Anesthesiologist: Mazzoni  Anesthesia: Spinal   Tourniquet Time: 59 min  EBL: 50cc  IVF: 650cc  Complications: None   Brief history: The patient is a 89 year old female with a history of osteoarthritis of the left knee with pain limiting their range of motion and activities of daily living, which has failed multiple attempts at conservative therapy.  The risks and benefits of total knee arthroplasty as definitive surgical treatment were discussed with the patient, who opted to proceed with the operation.  After outpatient medical clearance and optimization was completed the patient was admitted to [facility] for the procedure.  All preoperative films were reviewed and an appropriate surgical plan was made prior to surgery. Preoperative range of motion was 0 to 110.   Description of procedure: The patient was brought to the operating room where laterality was confirmed by all those present to be the left side.   Spinal anesthesia was administered and the patient received an intravenous dose of antibiotics for surgical prophylaxis and a dose of tranexamic acid.  Patient is positioned supine on the operating room table with all bony prominences well-padded.  A well-padded tourniquet was applied to the left thigh.  The knee was then  prepped and draped in usual sterile fashion with multiple layers of adhesive and nonadhesive drapes.  All of those present in the operating room participated in a surgical timeout laterality and patient were confirmed.   An Esmarch was wrapped around the extremity and the leg was elevated and the knee flexed.  The tourniquet was inflated to a pressure of 250 mmHg. The Esmarch was removed and the leg was brought down to full extension.  The patella and tibial tubercle identified and outlined using a marking pen and a midline skin incision was made with a knife carried through the subcutaneous tissue down to the extensor retinaculum.  After exposure of the extensor mechanism the medial parapatellar arthrotomy was performed with a scalpel and electrocautery extending down medial and distal to the tibial tubercle taking care to avoid incising the patellar tendon.   A standard medial release was performed over the proximal tibia.  The knee was brought into extension in order to excise the fat pad taking care not to damage the patella tendon.  The superior soft tissue was removed from the anterior surface of the distal femur to visualize for the procedure.  The knee was then brought into flexion with the patella subluxed laterally and subluxing the tibia anteriorly.  The ACL was transected and removed with electrocautery and additional soft tissue was removed from the proximal surface of the tibia to fully expose. The PCL was found to be intact and was preserved.  An extramedullary tibial cutting guide was then applied to the leg with a spring-loaded ankle clamp placed around the distal tibia just above the malleoli the angulation of the guide was adjusted to give some posterior  slope in the tibial resection with an appropriate varus/valgus alignment.  The resection guide was then pinned to the proximal tibia and the proximal tibial surface was resected with an oscillating saw.  Careful attention was paid to ensure  the blade did not disrupt any of the soft tissues including any lateral or medial ligament.  Attention was then turned to the femur, with the knee slightly flexed a opening drill was used to enter the medullary canal of the femur.  After removing the drill marrow was suctioned out to decompress the distal femur.  An intramedullary femoral guide was then inserted into the drill hole and the alignment guide was seated firmly against the distal end of the medial femoral condyle.  The distal femoral cutting guide was then attached and pinned securely to the anterior surface of the femur and the intramedullary rod and alignment guide was removed.  Distal femur resection was then performed with an oscillating saw with retractors protecting medial and laterally.   The distal cutting block was then removed and the extension gap was checked with a spacer.  Extension gap was found to be appropriately sized to accommodate the spacer block.   The femoral sizing guide was then placed securely into the posterior condyles of the femur and the femoral size was measured and determined to be 7.  The size 7; 4-in-1 cutting guide was placed in position and secured with 2 pins.  The anterior posterior and chamfer resections were then performed with an oscillating saw.  Bony fragments and osteophytes were then removed.  Using a lamina spreader the posterior medial and lateral condyles were checked for additional osteophytes and posterior soft tissue remnants.  Any remaining meniscus was removed at this time.  Periarticular injection was performed in the meniscal rims and posterior capsule with aspiration performed to ensure no intravascular injection.   The tibia was then exposed and the tibial trial was pinned onto the plateau after confirming appropriate orientation and rotation.  Using the drill bushing the tibia was prepared to the appropriate drill depth.  Tibial broach impactor was then driven through the punch guide using  a mallet.  The femoral trial component was then inserted onto the femur. A trial tibial polyethylene bearing was then placed and the knee was reduced.  The knee achieved full extension with no hyperextension and was found to be balanced in flexion and extension with the trials in place.  The knee was then brought into full extension the patella was everted and held with 2 Kocher clamps.  The articular surface of the patella was then resected with an patella reamer and saw after careful measurement with a caliper.  The patella was then prepared with the drill guide and a trial patella was placed.  The knee was then taken through range of motion and it was found that the patella articulated appropriately with the trochlea and good patellofemoral motion without subluxation.    The correct final components for implantation were confirmed and opened by the circulator nurse.  A bone plug was fashioned from the patient's bone cuts to plug the intramedullary femoral canal access hole and was tamped into place.  The prepared surfaces of the patella femur and tibia were cleaned with pulsatile lavage to remove all blood fat and other material and then the surfaces were dried.  2 bags of cement were mixed under vacuum and the components were cemented into place.  Excess cement was removed with curettes and forceps. A trial polyethylene tibial  component was placed and the knee was brought into extension to allow the cement to set.  At this time the periarticular injection cocktail was placed in the soft tissues surrounding the knee.  After full curing of the cement the balance of the knee was checked again and the final polyethylene size was confirmed. The tibial component was irrigated and locking mechanism checked to ensure it was clear of debris. The real polyethylene tibial component was implanted and the knee was brought through a range of motion.   The knee was then irrigated with copious amount of normal saline via  pulsatile lavage to remove all loose bodies and other debris.  The knee was then irrigated with surgiphor betadine  based wash and reirrigated with saline.  The tourniquet was then dropped and all bleeding vessels were identified and coagulated.  The arthrotomy was approximated with #1 Vicryl and closed with #1 Stratafix suture.  The knee was brought into slight flexion and the subcutaneous tissues were closed with 0 Vicryl, 2-0 Vicryl and a running subcuticular 4-0 stratafix barbed suture.  Skin was then glued with Dermabond.  A sterile adhesive dressing was then placed along with a sequential compression device to the calf, a Ted stocking, and a cryotherapy cuff.   Sponge, needle, and Lap counts were all correct at the end of the case.   The patient was transferred off of the operating room table to a hospital bed, good pulses were found distally on the operative side.  The patient was transferred to the recovery room in stable condition.

## 2024-06-02 NOTE — Transfer of Care (Signed)
 Immediate Anesthesia Transfer of Care Note  Patient: Cassandra Gentry  Procedure(s) Performed: ARTHROPLASTY, KNEE, TOTAL (Left: Knee)  Patient Location: PACU  Anesthesia Type:Spinal  Level of Consciousness: drowsy and patient cooperative  Airway & Oxygen Therapy: Patient Spontanous Breathing and Patient connected to face mask oxygen  Post-op Assessment: Report given to RN and Post -op Vital signs reviewed and stable  Post vital signs: Reviewed and stable  Last Vitals:  Vitals Value Taken Time  BP 146/52 06/02/24 12:54  Temp 36.7 C 06/02/24 12:54  Pulse 62 06/02/24 12:54  Resp 15 06/02/24 12:54  SpO2 100 % 06/02/24 12:54    Last Pain:  Vitals:   06/02/24 1254  TempSrc: Tympanic  PainSc: 0-No pain         Complications: No notable events documented.

## 2024-06-02 NOTE — Interval H&P Note (Signed)
 Patient history and physical updated. Consent reviewed including risks, benefits, and alternatives to surgery. Patient agrees with above plan to proceed with left total knee arthroplasty.

## 2024-06-02 NOTE — Anesthesia Procedure Notes (Signed)
 Procedure Name: MAC Date/Time: 06/02/2024 10:29 AM  Performed by: Lorrene Camelia LABOR, CRNAPre-anesthesia Checklist: Patient identified, Emergency Drugs available, Suction available and Patient being monitored Patient Re-evaluated:Patient Re-evaluated prior to induction Oxygen Delivery Method: Simple face mask Preoxygenation: Pre-oxygenation with 100% oxygen Induction Type: IV induction

## 2024-06-02 NOTE — Plan of Care (Signed)
   Problem: Activity: Goal: Ability to avoid complications of mobility impairment will improve Outcome: Progressing   Problem: Pain Management: Goal: Pain level will decrease with appropriate interventions Outcome: Progressing

## 2024-06-02 NOTE — Anesthesia Preprocedure Evaluation (Addendum)
 Anesthesia Evaluation  Patient identified by MRN, date of birth, ID band Patient awake    Reviewed: Allergy & Precautions, NPO status , Patient's Chart, lab work & pertinent test results  History of Anesthesia Complications Negative for: history of anesthetic complications  Airway Mallampati: IV   Neck ROM: Full    Dental  (+) Missing, Loose, Chipped   Pulmonary neg pulmonary ROS   Pulmonary exam normal breath sounds clear to auscultation       Cardiovascular hypertension, + CAD (s/p CABG)  Normal cardiovascular exam Rhythm:Regular Rate:Normal  ECG 05/25/24:  Sinus rhythm with Premature atrial complexes in a pattern of bigeminy Otherwise normal ECG   Neuro/Psych negative neurological ROS     GI/Hepatic ,GERD  ,,  Endo/Other  diabetes, Type 1, Insulin Dependent    Renal/GU negative Renal ROS     Musculoskeletal  (+) Arthritis ,    Abdominal   Peds  Hematology  (+) Blood dyscrasia, anemia CLL   Anesthesia Other Findings   Reproductive/Obstetrics                              Anesthesia Physical Anesthesia Plan  ASA: 3  Anesthesia Plan: Spinal   Post-op Pain Management:    Induction: Intravenous  PONV Risk Score and Plan: 3 and Propofol  infusion, TIVA, Treatment may vary due to age or medical condition and Ondansetron   Airway Management Planned: Natural Airway and Nasal Cannula  Additional Equipment:   Intra-op Plan:   Post-operative Plan:   Informed Consent: I have reviewed the patients History and Physical, chart, labs and discussed the procedure including the risks, benefits and alternatives for the proposed anesthesia with the patient or authorized representative who has indicated his/her understanding and acceptance.       Plan Discussed with: CRNA  Anesthesia Plan Comments: (Plan for spinal and GA with natural airway, LMA/GETA backup.  Patient consented for risks  of anesthesia including but not limited to:  - adverse reactions to medications - damage to eyes, teeth, lips or other oral mucosa - nerve damage due to positioning  - sore throat or hoarseness - headache, bleeding, infection, nerve damage 2/2 spinal - damage to heart, brain, nerves, lungs, other parts of body or loss of life  Informed patient about role of CRNA in peri- and intra-operative care.  Patient voiced understanding.)         Anesthesia Quick Evaluation

## 2024-06-02 NOTE — Anesthesia Procedure Notes (Signed)
 Spinal  Patient location during procedure: OR Start time: 06/02/2024 10:29 AM End time: 06/02/2024 10:49 AM Reason for block: surgical anesthesia Staffing Performed: anesthesiologist  Anesthesiologist: Fleeta Blush, Arno FALCON, MD Performed by: Lorrene Camelia LABOR, CRNA Authorized by: Shellie Odor, MD   Preanesthetic Checklist Completed: patient identified, IV checked, site marked, risks and benefits discussed, surgical consent, monitors and equipment checked and pre-op evaluation Spinal Block Patient position: sitting Prep: ChloraPrep and site prepped and draped Patient monitoring: heart rate, continuous pulse ox, blood pressure and cardiac monitor Approach: midline Location: L2-3 Injection technique: single-shot Needle Needle type: Introducer and Quincke  Needle gauge: 22 G Needle length: 9 cm Assessment Events: CSF return Additional Notes Negative paresthesia. Negative blood return. Positive free-flowing CSF. Expiration date of kit checked and confirmed. Patient tolerated procedure well, without complications.

## 2024-06-02 NOTE — Discharge Instructions (Signed)

## 2024-06-02 NOTE — Evaluation (Signed)
 Physical Therapy Evaluation Patient Details Name: Cassandra Gentry MRN: [id number] DOB: 1933/04/27 Today's Date: 06/02/2024  History of Present Illness  Cassandra Gentry is POD 0 after L TKA on day of PT evaluation (06/02/2024). Significant PMH per MD note includes: type 1 diabetes, GERD, chronic leukemia, HTN, and osteoporosis.  Clinical Impression  Cassandra Gentry is POD 0 after L TKA on day of PT evaluation (06/02/2024). Prior to surgery, pt was idependent with ambulation and ADLs. The uses a rollator for ambulation around her independent living facility and [facility] when ambulating in the community. Pt lives alone. Pt is mod I for bed mobility; she requires increased time/effort to move from supine<>sit. She requires CGA assist for transfers and ambulation. Verbal cues for RW management as pt has tendency to push RW too far ahead of her. Pt requires use of RW d/t decreased balance. Pt demonstrates limited ROM after surgery which has affected her gait/balance. Pt demonstrates knee flexion during stance and step-to gait pattern. Pt demonstrates deficits in strength/ROM/activity tolerance. Would benefit from skilled PT to address above deficits and promote optimal return to PLOF.       If plan is discharge home, recommend the following: A little help with walking and/or transfers;A little help with bathing/dressing/bathroom;Help with stairs or ramp for entrance   Can travel by private vehicle   Yes    Equipment Recommendations Rolling walker (2 wheels);BSC/3in1  Recommendations for Other Services       Functional Status Assessment Patient has had a recent decline in their functional status and demonstrates the ability to make significant improvements in function in a reasonable and predictable amount of time.     Precautions / Restrictions Precautions Precautions: Fall Recall of Precautions/Restrictions: Intact Restrictions Weight Bearing Restrictions Per Provider Order: Yes LLE Weight  Bearing Per Provider Order: Weight bearing as tolerated      Mobility  Bed Mobility Overal bed mobility: Modified Independent             General bed mobility comments: increased time/effort to move from supine<>sit. Has to use momentum.    Transfers Overall transfer level: Needs assistance Equipment used: Rolling walker (2 wheels) Transfers: Sit to/from Stand Sit to Stand: Contact guard assist           General transfer comment: CGA for safety. Verbal cuing for hand placement and to encourage weight bearing in LLE    Ambulation/Gait Ambulation/Gait assistance: Contact guard assist Gait Distance (Feet): 50 Feet Assistive device: Rolling walker (2 wheels) Gait Pattern/deviations: Knee flexed in stance - left, Step-to pattern, Trunk flexed       General Gait Details: Verbal cuing for technique; pt has tendency to push RW too far in front. D/t limited ROM, pt has gait deviations mentioned.  Stairs            Wheelchair Mobility     Tilt Bed    Modified Rankin (Stroke Patients Only)       Balance Overall balance assessment: Needs assistance Sitting-balance support: Feet supported Sitting balance-Leahy Scale: Good Sitting balance - Comments: Able to maintain seated balance seated at EOB. Able to don undergarments with CGA while sitting.   Standing balance support: During functional activity, Reliant on assistive device for balance, Bilateral upper extremity supported Standing balance-Leahy Scale: Fair Standing balance comment: CGA for STS for safety. Pt demonstrates fair standing balance while donning undergarment, however requires SUE support to avoid LOB. A little impulsive; pt stands before SPT/room set up is ready.  Pertinent Vitals/Pain Pain Assessment Pain Assessment: 0-10 Pain Score: 4  Pain Location: left knee Pain Descriptors / Indicators: Discomfort Pain Intervention(s): Monitored during session     Home Living Family/patient expects to be discharged to:: Private residence (Independent living at [facility] [place]) Living Arrangements: Alone   Type of Home: Independent living facility Home Access: Level entry       Home Layout: One level Home Equipment: Cane - single point;Rollator (4 wheels)      Prior Function Prior Level of Function : Independent/Modified Independent             Mobility Comments: Uses rollator inside independent living facility and [facility] while out in the community. ADLs Comments: independent     Extremity/Trunk Assessment   Upper Extremity Assessment Upper Extremity Assessment: Overall WFL for tasks assessed    Lower Extremity Assessment Lower Extremity Assessment: Overall WFL for tasks assessed       Communication   Communication Communication: No apparent difficulties    Cognition Arousal: Alert Behavior During Therapy: WFL for tasks assessed/performed   PT - Cognitive impairments: No apparent impairments                       PT - Cognition Comments: A&Ox4. pleasant and agreeable to PT session Following commands: Intact       Cueing Cueing Techniques: Verbal cues     General Comments General comments (skin integrity, edema, etc.): Pt able to perform SLR and sensation is intact    Exercises Total Joint Exercises Goniometric ROM: 12-55 Other Exercises Other Exercises: Edu about positioning without pillow under knee.   Assessment/Plan    PT Assessment Patient needs continued PT services  PT Problem List Decreased strength;Decreased range of motion;Decreased activity tolerance;Decreased balance;Decreased mobility;Decreased knowledge of use of DME;Decreased safety awareness       PT Treatment Interventions DME instruction;Gait training;Functional mobility training;Therapeutic activities;Therapeutic exercise;Stair training;Neuromuscular re-education;Balance training;Patient/family education    PT Goals  (Current goals can be found in the Care Plan section)  Acute Rehab PT Goals Patient Stated Goal: to go to rehab PT Goal Formulation: With patient Time For Goal Achievement: 06/16/24 Potential to Achieve Goals: Good    Frequency BID     Co-evaluation               AM-PAC PT 6 Clicks Mobility  Outcome Measure Help needed turning from your back to your side while in a flat bed without using bedrails?: A Little Help needed moving from lying on your back to sitting on the side of a flat bed without using bedrails?: A Little Help needed moving to and from a bed to a chair (including a wheelchair)?: A Little Help needed standing up from a chair using your arms (e.g., wheelchair or bedside chair)?: A Little Help needed to walk in hospital room?: A Little Help needed climbing 3-5 steps with a railing? : A Lot 6 Click Score: 17    End of Session Equipment Utilized During Treatment: Gait belt Activity Tolerance: Patient tolerated treatment well Patient left: in bed;with call bell/phone within reach;with family/visitor present Nurse Communication: Mobility status PT Visit Diagnosis: Muscle weakness (generalized) (M62.81);Difficulty in walking, not elsewhere classified (R26.2)    Time: 8447-8388 PT Time Calculation (min) (ACUTE ONLY): 19 min   Charges:                 Gerre Ranum, SPT   Kaleen Rochette 06/02/2024, 4:33 PM

## 2024-06-02 NOTE — Discharge Summary (Addendum)
 Physician Discharge Summary  Patient ID: Cassandra Gentry MRN: [id number] DOB/AGE: 88-Jun-1934 88 y.o.  Admit date: 06/02/2024 Discharge date: 06/03/2024  Admission Diagnoses:  Primary osteoarthritis of left knee [M17.12] S/P TKR (total knee replacement), left [Z96.652]   Discharge Diagnoses: Patient Active Problem List   Diagnosis Date Noted   S/P TKR (total knee replacement), left 06/02/2024   IDA (iron  deficiency anemia) 10/25/2020   MGUS (monoclonal gammopathy of unknown significance) 10/19/2020   Goals of care, counseling/discussion 10/03/2020   Hyponatremia 10/03/2020   Absolute anemia 10/03/2020   CLL (chronic lymphocytic leukemia) (HCC) 10/03/2020    Past Medical History:  Diagnosis Date   Anemia    Arthritis    Chronic lymphocytic leukemia (HCC)    Complication of anesthesia    SICK X 3 DAYS AFTER LAST CATARACT. LM FOR HER TO CALL BACK WITH MORE INFO   Coronary artery disease    Diabetes mellitus without complication (HCC)    Diverticulosis    Dysrhythmia    Edema    LEFT LEG   GERD (gastroesophageal reflux disease)    Hyperlipidemia    Hypertension    Thyroid  disease      Transfusion: none   Consultants (if any):   Discharged Condition: Improved  Hospital Course: Cassandra Gentry is an 89 y.o. female who was admitted 06/02/2024 with a diagnosis of S/P TKR (total knee replacement), left and went to the operating room on 06/02/2024 and underwent the above named procedures.    Surgeries: Procedure(s): ARTHROPLASTY, KNEE, TOTAL on 06/02/2024 Patient tolerated the surgery well. Taken to PACU where she was stabilized and then transferred to the orthopedic floor.  Started on Lovenox 30 mg q 12 hrs. TEDs and SCDs applied bilaterally. Heels elevated on bed. No evidence of DVT. Negative Homan. Physical therapy started on day #1 for gait training and transfer. OT started day #1 for ADL and assisted devices.  Patient's IV was d/c on day #1. Patient was able to safely  and independently complete all PT goals. PT recommending discharge to SNF at [facility] [place].    On post op day #1 patient was stable and ready for discharge to SNF at [facility] [place].   Implants:  Femur: Persona Size Size 7 Std CR Left   Tibia: Persona Size D w/ 30x14 mm stem ext  Poly: 11mm  MC  Patella: 32mm symmetric button    She was given perioperative antibiotics:  Anti-infectives (From admission, onward)    Start     Dose/Rate Route Frequency Ordered Stop   06/02/24 1700  ceFAZolin (ANCEF) IVPB 2g/100 mL premix        2 g 200 mL/hr over 30 Minutes Intravenous Every 6 hours 06/02/24 1545 06/02/24 2237   06/02/24 0845  ceFAZolin (ANCEF) IVPB 2g/100 mL premix        2 g 200 mL/hr over 30 Minutes Intravenous On call to O.R. 06/02/24 9166 06/02/24 1102     .  She was given sequential compression devices, early ambulation, and Lovenox TEDs for DVT prophylaxis.  She benefited maximally from the hospital stay and there were no complications.    Recent vital signs:  Vitals:   06/03/24 0443 06/03/24 0753  BP: 133/64 (!) 141/66  Pulse: 70 66  Resp: 15 17  Temp: (!) 96.9 F (36.1 C) (!) 97.3 F (36.3 C)  SpO2: 96% 96%    Recent laboratory studies:  Lab Results  Component Value Date   HGB 9.0 (L) 06/03/2024  HGB 10.5 (L) 01/06/2024   HGB 10.5 (L) 07/07/2023   Lab Results  Component Value Date   WBC 21.8 (H) 06/03/2024   PLT 224 06/03/2024   No results found for: INR Lab Results  Component Value Date   NA 134 (L) 06/03/2024   K 3.8 06/03/2024   CL 104 06/03/2024   CO2 23 06/03/2024   BUN 24 (H) 06/03/2024   CREATININE 0.96 06/03/2024   GLUCOSE 78 06/03/2024    Discharge Medications:   Allergies as of 06/03/2024       Reactions   Amoxicillin-pot Clavulanate Nausea Only, Other (See Comments)   Has patient had a PCN reaction causing immediate rash, facial/tongue/throat swelling, SOB or lightheadedness with hypotension: No Has patient had a PCN  reaction causing severe rash involving mucus membranes or skin necrosis: No Has patient had a PCN reaction that required hospitalization: No Has patient had a PCN reaction occurring within the last 10 years: No If all of the above answers are NO, then may proceed with Cephalosporin use.   Ampicillin    Iodinated Contrast Media Nausea Only   Shellfish Allergy Nausea Only   Simvastatin Other (See Comments)   Joint pain        Medication List     STOP taking these medications    acetaminophen  650 MG CR tablet Commonly known as: TYLENOL  Replaced by: acetaminophen  500 MG tablet       TAKE these medications    acetaminophen  500 MG tablet Commonly known as: TYLENOL  Take 2 tablets (1,000 mg total) by mouth every 8 (eight) hours. Replaces: acetaminophen  650 MG CR tablet   alendronate 70 MG tablet Commonly known as: FOSAMAX Take 70 mg by mouth once a week. Take with a full glass of water on an empty stomach.   aspirin EC 81 MG tablet Take 81 mg by mouth daily. Swallow whole.   celecoxib 100 MG capsule Commonly known as: CeleBREX Take 1 capsule (100 mg total) by mouth 2 (two) times daily for 7 days.   chlorthalidone 25 MG tablet Commonly known as: HYGROTON Take 12.5 mg by mouth every Monday, Wednesday, and Friday.   cholecalciferol 25 MCG (1000 UNIT) tablet Commonly known as: VITAMIN D3 Take 5,000 Units by mouth daily after supper.   CITRACAL + D PO Take 1 tablet by mouth daily.   clotrimazole 1 % cream Commonly known as: LOTRIMIN Apply 1 Application topically 2 (two) times a week.   cyanocobalamin  500 MCG tablet Commonly known as: VITAMIN B12 Take 500 mcg by mouth every 7 (seven) days.   docusate sodium 100 MG capsule Commonly known as: COLACE Take 1 capsule (100 mg total) by mouth 2 (two) times daily.   enoxaparin 40 MG/0.4ML injection Commonly known as: LOVENOX Inject 0.4 mLs (40 mg total) into the skin daily for 14 days.   ezetimibe 10 MG  tablet Commonly known as: ZETIA Take 10 mg by mouth daily.   ferrous sulfate  325 (65 FE) MG EC tablet Take 1 tablet (325 mg total) by mouth daily. What changed: when to take this   gabapentin 100 MG capsule Commonly known as: NEURONTIN Take 100 mg by mouth 2 (two) times daily.   glucose 4 GM chewable tablet Chew 1 tablet by mouth as needed for low blood sugar.   HYDROcodone-acetaminophen  5-325 MG tablet Commonly known as: NORCO/VICODIN Take 1 tablet by mouth every 6 (six) hours as needed for severe pain (pain score 7-10).   insulin aspart 100 UNIT/ML injection Commonly  known as: novoLOG Inject into the skin daily. Via insulin pump   insulin pump Soln Inject into the skin. insulin aspart (NOVOLOG) 100 unit/ml   ipratropium 0.03 % nasal spray Commonly known as: ATROVENT Place 1-2 sprays into both nostrils 3 (three) times daily.   losartan 100 MG tablet Commonly known as: COZAAR Take 100 mg by mouth daily.   metoprolol succinate 50 MG 24 hr tablet Commonly known as: TOPROL-XL Take 25 mg by mouth 2 (two) times daily.   multivitamin with minerals Tabs tablet Take 1 tablet by mouth daily. Multivitamin for Women 50+   NATURAL PSYLLIUM SEED PO Take 1 capsule by mouth once a week.   ondansetron  4 MG tablet Commonly known as: ZOFRAN  Take 1 tablet (4 mg total) by mouth every 6 (six) hours as needed for nausea.   OSTEO BI-FLEX REGULAR STRENGTH PO Take 1 tablet by mouth daily after supper.   pantoprazole 40 MG tablet Commonly known as: PROTONIX Take 40 mg by mouth daily before breakfast.   PRESERVISION AREDS 2 PO Take 1 capsule by mouth in the morning and at bedtime.   traMADol 50 MG tablet Commonly known as: ULTRAM Take 0.5-1 tablets (25-50 mg total) by mouth every 6 (six) hours as needed for moderate pain (pain score 4-6).   vitamin C 100 MG tablet Take 100 mg by mouth daily after supper.               Durable Medical Equipment  (From admission, onward)            Start     Ordered   06/02/24 1336  For home use only DME Walker rolling  Once       Question Answer Comment  Walker: With 5 Inch Wheels   Patient needs a walker to treat with the following condition H/O total knee replacement, left      06/02/24 1335   06/02/24 1336  For home use only DME 3 n 1  Once        06/02/24 1335            Diagnostic Studies: DG Knee Left Port Result Date: 06/02/2024 CLINICAL DATA:  Status post left total knee replacement. EXAM: PORTABLE LEFT KNEE - 1-2 VIEW COMPARISON:  None Available. FINDINGS: Femoral and tibial components are well situated. Expected postoperative changes are noted in the soft tissues anteriorly. IMPRESSION: Status post left total knee arthroplasty. Electronically Signed   By: Lynwood Landy Raddle M.D.   On: 06/02/2024 14:42    Disposition: Discharge disposition: 03-Skilled Nursing Facility          Follow-up Information     Charlene Debby BROCKS, PA-C Follow up in 2 week(s).   Specialties: Orthopedic Surgery, Emergency Medicine Contact information: [address] [place] [address] [phone]                  Signed: Debby BROCKS Charlene 06/03/2024, 2:01 PM

## 2024-06-02 NOTE — Progress Notes (Signed)
 Patient is not able to walk the distance required to go the bathroom, or she is unable to safely negotiate stairs required to access the bathroom.  A 3in1 BSC will alleviate this problem.       Lollie Marrow, PA-C [facility]

## 2024-06-03 ENCOUNTER — Other Ambulatory Visit: Payer: Self-pay

## 2024-06-03 ENCOUNTER — Encounter: Payer: Self-pay | Admitting: Orthopedic Surgery

## 2024-06-03 DIAGNOSIS — M170 Bilateral primary osteoarthritis of knee: Secondary | ICD-10-CM | POA: Diagnosis not present

## 2024-06-03 LAB — GLUCOSE, CAPILLARY
Glucose-Capillary: 101 mg/dL — ABNORMAL HIGH (ref 70–99)
Glucose-Capillary: 142 mg/dL — ABNORMAL HIGH (ref 70–99)
Glucose-Capillary: 54 mg/dL — ABNORMAL LOW (ref 70–99)
Glucose-Capillary: 76 mg/dL (ref 70–99)

## 2024-06-03 LAB — CBC
HCT: 26.9 % — ABNORMAL LOW (ref 36.0–46.0)
Hemoglobin: 9 g/dL — ABNORMAL LOW (ref 12.0–15.0)
MCH: 29 pg (ref 26.0–34.0)
MCHC: 33.5 g/dL (ref 30.0–36.0)
MCV: 86.8 fL (ref 80.0–100.0)
Platelets: 224 K/uL (ref 150–400)
RBC: 3.1 MIL/uL — ABNORMAL LOW (ref 3.87–5.11)
RDW: 13.7 % (ref 11.5–15.5)
WBC: 21.8 K/uL — ABNORMAL HIGH (ref 4.0–10.5)
nRBC: 0 % (ref 0.0–0.2)

## 2024-06-03 LAB — BASIC METABOLIC PANEL WITH GFR
Anion gap: 7 (ref 5–15)
BUN: 24 mg/dL — ABNORMAL HIGH (ref 8–23)
CO2: 23 mmol/L (ref 22–32)
Calcium: 8 mg/dL — ABNORMAL LOW (ref 8.9–10.3)
Chloride: 104 mmol/L (ref 98–111)
Creatinine, Ser: 0.96 mg/dL (ref 0.44–1.00)
GFR, Estimated: 57 mL/min — ABNORMAL LOW (ref >60)
Glucose, Bld: 78 mg/dL (ref 70–99)
Potassium: 3.8 mmol/L (ref 3.5–5.1)
Sodium: 134 mmol/L — ABNORMAL LOW (ref 135–145)

## 2024-06-03 MED ORDER — TRAMADOL HCL 50 MG PO TABS: 25.0000 mg | ORAL_TABLET | Freq: Four times a day (QID) | ORAL | 0 refills | Status: AC | PRN

## 2024-06-03 MED ORDER — ASPIRIN 81 MG PO CHEW
CHEWABLE_TABLET | ORAL | Status: AC
  Filled 2024-06-03: qty 1

## 2024-06-03 MED ORDER — HYDROCODONE-ACETAMINOPHEN 5-325 MG PO TABS: 1.0000 | ORAL_TABLET | Freq: Four times a day (QID) | ORAL | 0 refills | Status: AC | PRN

## 2024-06-03 NOTE — NC FL2 (Signed)
 [place]  MEDICAID FL2 LEVEL OF CARE FORM     IDENTIFICATION  Patient Name: Cassandra Gentry Birthdate: 23-Jul-1933 Sex: female Admission Date (Current Location): 06/02/2024  [facility] and [place] Number:  Chiropodist and Address:  [facility], [address], [place], [place] [address]      Provider Number: [id number]  Attending Physician Name and Address:  Lorelle Hussar, MD  Relative Name and Phone Number:       Current Level of Care: SNF Recommended Level of Care: Skilled Nursing Facility Prior Approval Number:    Date Approved/Denied:   PASRR Number: [id number]  Discharge Plan: SNF    Current Diagnoses: Patient Active Problem List   Diagnosis Date Noted   S/P TKR (total knee replacement), left 06/02/2024   IDA (iron  deficiency anemia) 10/25/2020   MGUS (monoclonal gammopathy of unknown significance) 10/19/2020   Goals of care, counseling/discussion 10/03/2020   Hyponatremia 10/03/2020   Absolute anemia 10/03/2020   CLL (chronic lymphocytic leukemia) (HCC) 10/03/2020    Orientation RESPIRATION BLADDER Height & Weight     Self, Time, Situation, Place  Normal Continent Weight: 164 lb 10.9 oz (74.7 kg) Height:  5' 5 (165.1 cm)  BEHAVIORAL SYMPTOMS/MOOD NEUROLOGICAL BOWEL NUTRITION STATUS      Continent Diet (Carb Modified)  AMBULATORY STATUS COMMUNICATION OF NEEDS Skin   Limited Assist Verbally Surgical wounds (Surgical Closed incision Left Knee)                       Personal Care Assistance Level of Assistance  Bathing, Feeding, Dressing Bathing Assistance: Limited assistance Feeding assistance: Independent Dressing Assistance: Limited assistance     Functional Limitations Info  Sight, Hearing, Speech Sight Info: Impaired Hearing Info: Adequate Speech Info: Adequate    SPECIAL CARE FACTORS FREQUENCY  PT (By licensed PT), OT (By licensed OT)     PT Frequency: 5x/week OT Frequency: 5x/week             Contractures      Additional Factors Info  Code Status, Allergies Code Status Info: Full Allergies Info: Amoxicillin-pot Clavulanate, Ampicillin, Iodinated Contrast Media, Shellfish Allergy, Simvastatin           Current Medications (06/03/2024):  This is the current hospital active medication list Current Facility-Administered Medications  Medication Dose Route Frequency Provider Last Rate Last Admin   0.9 %  sodium chloride  infusion   Intravenous Continuous Aberman, Zachary, MD   Stopped at 06/03/24 0335   acetaminophen  (TYLENOL ) tablet 1,000 mg  1,000 mg Oral Q8H Aberman, Zachary, MD   1,000 mg at 06/03/24 0456   acetaminophen  (TYLENOL ) tablet 325-650 mg  325-650 mg Oral Q6H PRN Aberman, Zachary, MD       aspirin EC tablet 81 mg  81 mg Oral Daily Aberman, Zachary, MD       chlorthalidone (HYGROTON) tablet 12.5 mg  12.5 mg Oral Q M,W,F Aberman, Zachary, MD   12.5 mg at 06/03/24 0837   docusate sodium (COLACE) capsule 100 mg  100 mg Oral BID Aberman, Zachary, MD   100 mg at 06/03/24 0837   enoxaparin (LOVENOX) injection 30 mg  30 mg Subcutaneous Q12H Aberman, Zachary, MD   30 mg at 06/03/24 9161   ezetimibe (ZETIA) tablet 10 mg  10 mg Oral Daily Aberman, Zachary, MD   10 mg at 06/02/24 1634   gabapentin (NEURONTIN) capsule 100 mg  100 mg Oral BID Aberman, Zachary, MD   100 mg at 06/03/24 [phone]  HYDROcodone-acetaminophen  (NORCO/VICODIN) 5-325 MG per tablet 1-2 tablet  1-2 tablet Oral Q4H PRN Aberman, Zachary, MD   2 tablet at 06/02/24 2209   insulin pump   Subcutaneous Q4H Aberman, Zachary, MD   Given at 06/02/24 1957   ketorolac (TORADOL) 15 MG/ML injection 7.5 mg  7.5 mg Intravenous Q6H Aberman, Zachary, MD   7.5 mg at 06/03/24 0509   losartan (COZAAR) tablet 100 mg  100 mg Oral Daily Aberman, Zachary, MD   100 mg at 06/03/24 9162   menthol (CEPACOL) lozenge 3 mg  1 lozenge Oral PRN Aberman, Zachary, MD       Or   phenol (CHLORASEPTIC) mouth spray 1 spray  1 spray Mouth/Throat PRN  Aberman, Zachary, MD       metoCLOPramide (REGLAN) tablet 5-10 mg  5-10 mg Oral Q8H PRN Aberman, Zachary, MD       Or   metoCLOPramide (REGLAN) injection 5-10 mg  5-10 mg Intravenous Q8H PRN Aberman, Zachary, MD       metoprolol succinate (TOPROL-XL) 24 hr tablet 25 mg  25 mg Oral BID Aberman, Zachary, MD   25 mg at 06/03/24 9162   morphine (PF) 2 MG/ML injection 0.5-1 mg  0.5-1 mg Intravenous Q2H PRN Aberman, Zachary, MD       ondansetron  (ZOFRAN ) tablet 4 mg  4 mg Oral Q6H PRN Aberman, Zachary, MD       Or   ondansetron  (ZOFRAN ) injection 4 mg  4 mg Intravenous Q6H PRN Aberman, Zachary, MD       Oral care mouth rinse  15 mL Mouth Rinse PRN Aberman, Zachary, MD       pantoprazole (PROTONIX) EC tablet 40 mg  40 mg Oral Daily Aberman, Zachary, MD   40 mg at 06/03/24 9162   traMADol (ULTRAM) tablet 25-50 mg  25-50 mg Oral Q6H PRN Aberman, Zachary, MD   50 mg at 06/02/24 1634     Discharge Medications: Please see discharge summary for a list of discharge medications.  Relevant Imaging Results:  Relevant Lab Results:   Additional Information SSN: [id number]  Sherece Gambrill  [place], LCSW

## 2024-06-03 NOTE — Plan of Care (Signed)
  Problem: Education: Goal: Ability to describe self-care measures that may prevent or decrease complications (Diabetes Survival Skills Education) will improve Outcome: Progressing Goal: Individualized Educational Video(s) Outcome: Progressing   Problem: Coping: Goal: Ability to adjust to condition or change in health will improve Outcome: Progressing   Problem: Fluid Volume: Goal: Ability to maintain a balanced intake and output will improve Outcome: Progressing   Problem: Health Behavior/Discharge Planning: Goal: Ability to identify and utilize available resources and services will improve Outcome: Progressing Goal: Ability to manage health-related needs will improve Outcome: Progressing   Problem: Metabolic: Goal: Ability to maintain appropriate glucose levels will improve Outcome: Progressing   Problem: Nutritional: Goal: Maintenance of adequate nutrition will improve Outcome: Progressing Goal: Progress toward achieving an optimal weight will improve Outcome: Progressing   Problem: Skin Integrity: Goal: Risk for impaired skin integrity will decrease Outcome: Progressing   Problem: Tissue Perfusion: Goal: Adequacy of tissue perfusion will improve Outcome: Progressing   Problem: Education: Goal: Knowledge of the prescribed therapeutic regimen will improve Outcome: Progressing Goal: Individualized Educational Video(s) Outcome: Progressing   Problem: Activity: Goal: Ability to avoid complications of mobility impairment will improve Outcome: Progressing Goal: Range of joint motion will improve Outcome: Progressing   Problem: Clinical Measurements: Goal: Postoperative complications will be avoided or minimized Outcome: Progressing   Problem: Pain Management: Goal: Pain level will decrease with appropriate interventions Outcome: Progressing   Problem: Skin Integrity: Goal: Will show signs of wound healing Outcome: Progressing

## 2024-06-03 NOTE — Progress Notes (Addendum)
 Physical Therapy Treatment Patient Details Name: Cassandra Gentry MRN: [id number] DOB: [birth year] Today's Date: 06/03/2024   History of Present Illness Cassandra Gentry is POD 0 after L TKA on day of PT evaluation (06/02/2024). Significant PMH per MD note includes: type 1 diabetes, GERD, chronic leukemia, HTN, and osteoporosis.    PT Comments  Pt was in BR upon start of session. She is A and O x 4. Agreeable and motivated. She demonstrated safe abilities to stand and ambulate with supervision only. Pt continues to plan to go to skilled side of her community due to lack of assistance at home environment. Pt tolerated ambulation ~ 200 ft and perform ascending/descending stairs with BUE support and vcing. Overall pt is progressing well and will continue to benefit from skilled PT at DC to maximize independence and safety with all ADLs.    If plan is discharge home, recommend the following: A little help with walking and/or transfers;A little help with bathing/dressing/bathroom;Help with stairs or ramp for entrance     Equipment Recommendations  Rolling walker (2 wheels);BSC/3in1       Precautions / Restrictions Precautions Precautions: Fall Recall of Precautions/Restrictions: Intact Restrictions Weight Bearing Restrictions Per Provider Order: Yes LLE Weight Bearing Per Provider Order: Weight bearing as tolerated     Mobility  Bed Mobility  General bed mobility comments: pt was in bathroom at start of session and then in recliner at conclusion    Transfers Overall transfer level: Needs assistance Equipment used: Rolling walker (2 wheels) Transfers: Sit to/from Stand Sit to Stand: Supervision   Ambulation/Gait Ambulation/Gait assistance: Contact guard assist, Supervision Gait Distance (Feet): 200 Feet Assistive device: Rolling walker (2 wheels) Gait Pattern/deviations: Step-through pattern Gait velocity: decreased  General Gait Details: Pt was easily and safely able to ambulate 200  ft with RW. Vcs for posture and heel strike improvements    Balance Overall balance assessment: Needs assistance Sitting-balance support: Feet supported Sitting balance-Leahy Scale: Good     Standing balance support: During functional activity, Reliant on assistive device for balance Standing balance-Leahy Scale: Fair       Hotel manager: No apparent difficulties  Cognition Arousal: Alert Behavior During Therapy: WFL for tasks assessed/performed   PT - Cognitive impairments: No apparent impairments    PT - Cognition Comments: A&Ox4. pleasant and agreeable to PT session. Does like to dicatate session progression Following commands: Intact      Cueing Cueing Techniques: Verbal cues, Tactile cues         Pertinent Vitals/Pain Pain Assessment Pain Assessment: 0-10 Pain Score: 4  Pain Location: left knee Pain Descriptors / Indicators: Discomfort Pain Intervention(s): Limited activity within patient's tolerance, Monitored during session, Premedicated before session, Repositioned     PT Goals (current goals can now be found in the care plan section) Acute Rehab PT Goals Patient Stated Goal: to go to rehab Progress towards PT goals: Progressing toward goals    Frequency    BID       AM-PAC PT 6 Clicks Mobility   Outcome Measure  Help needed turning from your back to your side while in a flat bed without using bedrails?: A Little Help needed moving from lying on your back to sitting on the side of a flat bed without using bedrails?: A Little Help needed moving to and from a bed to a chair (including a wheelchair)?: A Little Help needed standing up from a chair using your arms (e.g., wheelchair or bedside chair)?: A Little Help needed  to walk in hospital room?: A Little Help needed climbing 3-5 steps with a railing? : A Little 6 Click Score: 18    End of Session   Activity Tolerance: Patient tolerated treatment well Patient left: in  bed;with call bell/phone within reach;with family/visitor present Nurse Communication: Mobility status PT Visit Diagnosis: Muscle weakness (generalized) (M62.81);Difficulty in walking, not elsewhere classified (R26.2)     Time: 9079-9055 PT Time Calculation (min) (ACUTE ONLY): 24 min  Charges:    $Gait Training: 8-22 mins $Therapeutic Activity: 8-22 mins PT General Charges $$ ACUTE PT VISIT: 1 Visit                    Rankin Essex PTA 06/03/24, 9:54 AM

## 2024-06-03 NOTE — Plan of Care (Signed)
   Problem: Activity: Goal: Ability to avoid complications of mobility impairment will improve Outcome: Progressing   Problem: Clinical Measurements: Goal: Postoperative complications will be avoided or minimized Outcome: Progressing   Problem: Pain Management: Goal: Pain level will decrease with appropriate interventions Outcome: Progressing

## 2024-06-03 NOTE — TOC Transition Note (Addendum)
 Transition of Care [facility]) - Discharge Note   Patient Details  Name: Cassandra Gentry MRN: [id number] Date of Birth: 12-15-1932  Transition of Care [facility]) CM/SW Contact:  Alvaro Louder, LCSW Phone Number: 06/03/2024, 3:58 PM   Clinical Narrative:   Burdette fearing coordinator indicated that the patient can admit while insurance shara is pending. Pending Certification #[id number]   LCSWA confirmed with MD that patient is stable for discharge. LCSWA notified the patient and they are in agreement with discharge. LCSWA confirmed bed is available at SNF. Transport arranged with [facility] for next available.  LCSWA discussed PT recommendation  of RW and BSC 3in1. Patient was agreeable, LCSWA reached out to [employer] coordinator to set up equipment delivery.  Number to call report [phone]   TOC signing off  Final next level of care: Skilled Nursing Facility     Patient Goals and CMS Choice            Discharge Placement                       Discharge Plan and Services Additional resources added to the After Visit Summary for                                       Social Drivers of Health (SDOH) Interventions SDOH Screenings   Food Insecurity: No Food Insecurity (06/02/2024)  Housing: Low Risk  (06/02/2024)  Transportation Needs: No Transportation Needs (06/02/2024)  Utilities: Not At Risk (06/02/2024)  Financial Resource Strain: Low Risk  (05/28/2024)   Received from [facility]  Physical Activity: Inactive (12/07/2021)   Received from [facility]  Social Connections: Unknown (06/02/2024)  Stress: No Stress Concern Present (02/14/2022)   Received from [facility]  Tobacco Use: Low Risk  (06/02/2024)     Readmission Risk Interventions     No data to display

## 2024-06-03 NOTE — Progress Notes (Signed)
   Subjective: 1 Day Post-Op Procedure(s) (LRB): ARTHROPLASTY, KNEE, TOTAL (Left) Patient reports pain as mild.   Patient is well, and has had no acute complaints or problems Denies any CP, SOB, ABD pain. We will continue therapy today.  Plan is to go Skilled nursing facility after hospital stay.  Objective: Vital signs in last 24 hours: Temp:  [96.9 F (36.1 C)-98.2 F (36.8 C)] 96.9 F (36.1 C) (10/03 0443) Pulse Rate:  [56-73] 70 (10/03 0443) Resp:  [11-18] 15 (10/03 0443) BP: (130-159)/(49-83) 133/64 (10/03 0443) SpO2:  [95 %-100 %] 96 % (10/03 0443) Weight:  [74.7 kg] 74.7 kg (10/02 1900)  Intake/Output from previous day: 10/02 0701 - 10/03 0700 In: 2350.6 [I.V.:1876.3; IV Piggyback:474.3] Out: 50 [Blood:50] Intake/Output this shift: No intake/output data recorded.  Recent Labs    06/03/24 0544  HGB 9.0*   Recent Labs    06/03/24 0544  WBC 21.8*  RBC 3.10*  HCT 26.9*  PLT 224   Recent Labs    06/03/24 0544  NA 134*  K 3.8  CL 104  CO2 23  BUN 24*  CREATININE 0.96  GLUCOSE 78  CALCIUM 8.0*   No results for input(s): LABPT, INR in the last 72 hours.  EXAM General - Patient is Alert, Appropriate, and Oriented Extremity - Neurovascular intact Sensation intact distally Intact pulses distally Dorsiflexion/Plantar flexion intact No cellulitis present Compartment soft Dressing - dressing C/D/I and no drainage Motor Function - intact, moving foot and toes well on exam.   Past Medical History:  Diagnosis Date   Anemia    Arthritis    Chronic lymphocytic leukemia (HCC)    Complication of anesthesia    SICK X 3 DAYS AFTER LAST CATARACT. LM FOR HER TO CALL BACK WITH MORE INFO   Coronary artery disease    Diabetes mellitus without complication (HCC)    Diverticulosis    Dysrhythmia    Edema    LEFT LEG   GERD (gastroesophageal reflux disease)    Hyperlipidemia    Hypertension    Thyroid  disease     Assessment/Plan:   1 Day Post-Op  Procedure(s) (LRB): ARTHROPLASTY, KNEE, TOTAL (Left) Principal Problem:   S/P TKR (total knee replacement), left  Estimated body mass index is 27.4 kg/m as calculated from the following:   Height as of this encounter: 5' 5 (1.651 m).   Weight as of this encounter: 74.7 kg. Advance diet Up with therapy Pain well controlled Labs and VSS CM to assist with discharge to home at [place] but not her apartment, the SNF side.  DVT Prophylaxis - Lovenox, TED hose, and SCDs Weight-Bearing as tolerated to left leg   T. Medford Amber, PA-C [facility] 06/03/2024, 7:46 AM

## 2024-06-03 NOTE — Progress Notes (Signed)
 Discharge Summary for Cassandra Gentry  Discharge Plan: Patient will be discharged home as per the MD's order. We discussed prescriptions and follow-up appointments with the patient. The prescriptions were provided, and the medication list was explained in detail. Patient confirmed understanding of the instructions.  Skin Assessment: The patient's skin is clean, dry, and intact, with no signs of breakdown or tears. The IV catheter was removed, and the skin remains intact. The site shows no signs or symptoms of complications. A dressing and pressure were applied to the site. Patient reports no pain and has no complaints.  After-Visit Summary: An After-Visit Summary was printed and given to the patient. The following items were sent home with patient:  3-in-1 bedside commode Front wheel rolling walker Corporate treasurer  Two TED hose placed on both patient legs Two printed prescriptions   The patient was escorted via wheelchair and discharged home in a private vehicle.  Talley Kreiser D. Geofm, RN

## 2024-07-12 ENCOUNTER — Telehealth: Payer: Self-pay | Admitting: Oncology

## 2024-07-12 ENCOUNTER — Inpatient Hospital Stay

## 2024-07-12 NOTE — Telephone Encounter (Signed)
 Pt missed labs today and will need them before MD appt next week.  I called and spoke with pt. She is in rehab due to knee surgery.    The next MD opening is 12/24. Pt stated she is going out of town for [place] and to push the appts to January.  Appts are scheduled for Jan. And new dates and times confirmed with pt

## 2024-07-21 ENCOUNTER — Inpatient Hospital Stay: Admitting: Oncology

## 2024-09-06 ENCOUNTER — Inpatient Hospital Stay: Attending: Oncology

## 2024-09-06 DIAGNOSIS — C9110 Chronic lymphocytic leukemia of B-cell type not having achieved remission: Secondary | ICD-10-CM

## 2024-09-06 LAB — CMP (CANCER CENTER ONLY)
ALT: 22 U/L (ref 0–44)
AST: 26 U/L (ref 15–41)
Albumin: 4.2 g/dL (ref 3.5–5.0)
Alkaline Phosphatase: 84 U/L (ref 38–126)
Anion gap: 14 (ref 5–15)
BUN: 11 mg/dL (ref 8–23)
CO2: 23 mmol/L (ref 22–32)
Calcium: 9.7 mg/dL (ref 8.9–10.3)
Chloride: 101 mmol/L (ref 98–111)
Creatinine: 0.89 mg/dL (ref 0.44–1.00)
GFR, Estimated: >60 mL/min
Glucose, Bld: 138 mg/dL — ABNORMAL HIGH (ref 70–99)
Potassium: 4.1 mmol/L (ref 3.5–5.1)
Sodium: 138 mmol/L (ref 135–145)
Total Bilirubin: 0.8 mg/dL (ref 0.0–1.2)
Total Protein: 6.7 g/dL (ref 6.5–8.1)

## 2024-09-06 LAB — CBC WITH DIFFERENTIAL (CANCER CENTER ONLY)
Abs Immature Granulocytes: 0.21 K/uL — ABNORMAL HIGH (ref 0.00–0.07)
Basophils Absolute: 0.2 K/uL — ABNORMAL HIGH (ref 0.0–0.1)
Basophils Relative: 1 %
Eosinophils Absolute: 0.6 K/uL — ABNORMAL HIGH (ref 0.0–0.5)
Eosinophils Relative: 4 %
HCT: 31.2 % — ABNORMAL LOW (ref 36.0–46.0)
Hemoglobin: 10.5 g/dL — ABNORMAL LOW (ref 12.0–15.0)
Immature Granulocytes: 1 %
Lymphocytes Relative: 56 %
Lymphs Abs: 9.9 K/uL — ABNORMAL HIGH (ref 0.7–4.0)
MCH: 28.4 pg (ref 26.0–34.0)
MCHC: 33.7 g/dL (ref 30.0–36.0)
MCV: 84.3 fL (ref 80.0–100.0)
Monocytes Absolute: 0.8 K/uL (ref 0.1–1.0)
Monocytes Relative: 4 %
Neutro Abs: 5.9 K/uL (ref 1.7–7.7)
Neutrophils Relative %: 34 %
Platelet Count: 359 K/uL (ref 150–400)
RBC: 3.7 MIL/uL — ABNORMAL LOW (ref 3.87–5.11)
RDW: 14 % (ref 11.5–15.5)
WBC Count: 17.5 K/uL — ABNORMAL HIGH (ref 4.0–10.5)
nRBC: 0 % (ref 0.0–0.2)

## 2024-09-06 LAB — FERRITIN: Ferritin: 79 ng/mL (ref 11–307)

## 2024-09-06 LAB — VITAMIN B12: Vitamin B-12: 768 pg/mL (ref 180–914)

## 2024-09-06 LAB — RETIC PANEL
Immature Retic Fract: 12.8 % (ref 2.3–15.9)
RBC.: 3.67 MIL/uL — ABNORMAL LOW (ref 3.87–5.11)
Retic Count, Absolute: 61.7 K/uL (ref 19.0–186.0)
Retic Ct Pct: 1.7 % (ref 0.4–3.1)
Reticulocyte Hemoglobin: 32.1 pg

## 2024-09-06 LAB — IRON AND TIBC
Iron: 46 ug/dL (ref 28–170)
Saturation Ratios: 13 % (ref 10.4–31.8)
TIBC: 356 ug/dL (ref 250–450)
UIBC: 309 ug/dL

## 2024-09-07 LAB — KAPPA/LAMBDA LIGHT CHAINS
Kappa free light chain: 52.8 mg/L — ABNORMAL HIGH (ref 3.3–19.4)
Kappa, lambda light chain ratio: 6.14 — ABNORMAL HIGH (ref 0.26–1.65)
Lambda free light chains: 8.6 mg/L (ref 5.7–26.3)

## 2024-09-08 LAB — MULTIPLE MYELOMA PANEL, SERUM
Albumin SerPl Elph-Mcnc: 3.4 g/dL (ref 2.9–4.4)
Albumin/Glob SerPl: 1.5 (ref 0.7–1.7)
Alpha 1: 0.3 g/dL (ref 0.0–0.4)
Alpha2 Glob SerPl Elph-Mcnc: 1 g/dL (ref 0.4–1.0)
B-Globulin SerPl Elph-Mcnc: 0.8 g/dL (ref 0.7–1.3)
Gamma Glob SerPl Elph-Mcnc: 0.3 g/dL — ABNORMAL LOW (ref 0.4–1.8)
Globulin, Total: 2.4 g/dL (ref 2.2–3.9)
IgA: 26 mg/dL — ABNORMAL LOW (ref 64–422)
IgG (Immunoglobin G), Serum: 269 mg/dL — ABNORMAL LOW (ref 586–1602)
IgM (Immunoglobulin M), Srm: 204 mg/dL (ref 26–217)
Total Protein ELP: 5.8 g/dL — ABNORMAL LOW (ref 6.0–8.5)

## 2024-09-14 ENCOUNTER — Telehealth: Payer: Self-pay | Admitting: Oncology

## 2024-09-14 ENCOUNTER — Telehealth: Payer: Self-pay | Admitting: *Deleted

## 2024-09-14 NOTE — Telephone Encounter (Signed)
 Patient cnl today's apts. She would like to know if Dr. Babara needs the collection for 24 hour urine prior to apts in Feb. She r/s her apts. She has been taking antibiotics for an upper respiratory infection and has started having some loose stools r/t to antibiotics use. She didn't want to come in the clinic today for this reason.  She r/s her apt with Dr. Babara out to 2/23. Pt made aware that she may go ahead and collect the 24 hour urine before this apt. I recommended that she have her gi symptoms resolve first before starting the collection period. I explained that the results for the 24 hour protein urine takes 7-10 to result. I asked her not to wait until 2/23 to submit the 24 hr urine sample. She gave verbal understanding.

## 2024-09-14 NOTE — Telephone Encounter (Signed)
 Patient left a voicemail asking to reschedule her appointment. Called patient back and rescheduled. Transferred call to triage she had questions about a Urine sample.

## 2024-09-15 ENCOUNTER — Inpatient Hospital Stay: Admitting: Oncology

## 2024-10-24 ENCOUNTER — Inpatient Hospital Stay: Admitting: Oncology
